# Patient Record
Sex: Male | Born: 1970 | Race: Black or African American | Hispanic: No | Smoking: Current every day smoker
Health system: Southern US, Community
[De-identification: ages and names within clinical notes are randomized; demographics above are authoritative.]

## PROBLEM LIST (undated history)

## (undated) DIAGNOSIS — I10 Essential (primary) hypertension: Secondary | ICD-10-CM

## (undated) DIAGNOSIS — K859 Acute pancreatitis without necrosis or infection, unspecified: Secondary | ICD-10-CM

## (undated) DIAGNOSIS — E119 Type 2 diabetes mellitus without complications: Secondary | ICD-10-CM

---

## 2020-02-27 ENCOUNTER — Inpatient Hospital Stay
Admission: EM | Admit: 2020-02-27 | Discharge: 2020-02-29 | DRG: 439 | Disposition: A | Discharge status: Home / Self Care | Payer: BC Managed Care – PPO | Attending: Internal Medicine | Admitting: Internal Medicine

## 2020-02-27 ENCOUNTER — Emergency Department: Payer: BC Managed Care – PPO

## 2020-02-27 ENCOUNTER — Encounter: Payer: Self-pay | Admitting: Emergency Medicine

## 2020-02-27 ENCOUNTER — Other Ambulatory Visit: Payer: Self-pay

## 2020-02-27 DIAGNOSIS — Z888 Allergy status to other drugs, medicaments and biological substances status: Secondary | ICD-10-CM | POA: Diagnosis not present

## 2020-02-27 DIAGNOSIS — Z20822 Contact with and (suspected) exposure to covid-19: Secondary | ICD-10-CM | POA: Diagnosis present

## 2020-02-27 DIAGNOSIS — E781 Pure hyperglyceridemia: Secondary | ICD-10-CM | POA: Diagnosis present

## 2020-02-27 DIAGNOSIS — Z8249 Family history of ischemic heart disease and other diseases of the circulatory system: Secondary | ICD-10-CM | POA: Diagnosis not present

## 2020-02-27 DIAGNOSIS — R1013 Epigastric pain: Secondary | ICD-10-CM

## 2020-02-27 DIAGNOSIS — E860 Dehydration: Secondary | ICD-10-CM | POA: Diagnosis present

## 2020-02-27 DIAGNOSIS — Z720 Tobacco use: Secondary | ICD-10-CM | POA: Diagnosis present

## 2020-02-27 DIAGNOSIS — F1010 Alcohol abuse, uncomplicated: Secondary | ICD-10-CM | POA: Diagnosis present

## 2020-02-27 DIAGNOSIS — E876 Hypokalemia: Secondary | ICD-10-CM | POA: Diagnosis present

## 2020-02-27 DIAGNOSIS — E1169 Type 2 diabetes mellitus with other specified complication: Secondary | ICD-10-CM | POA: Diagnosis present

## 2020-02-27 DIAGNOSIS — K852 Alcohol induced acute pancreatitis without necrosis or infection: Secondary | ICD-10-CM | POA: Diagnosis present

## 2020-02-27 DIAGNOSIS — Z794 Long term (current) use of insulin: Secondary | ICD-10-CM

## 2020-02-27 DIAGNOSIS — Z79899 Other long term (current) drug therapy: Secondary | ICD-10-CM | POA: Diagnosis not present

## 2020-02-27 DIAGNOSIS — IMO0002 Reserved for concepts with insufficient information to code with codable children: Secondary | ICD-10-CM | POA: Diagnosis present

## 2020-02-27 DIAGNOSIS — K859 Acute pancreatitis without necrosis or infection, unspecified: Secondary | ICD-10-CM | POA: Diagnosis not present

## 2020-02-27 DIAGNOSIS — F17210 Nicotine dependence, cigarettes, uncomplicated: Secondary | ICD-10-CM | POA: Diagnosis present

## 2020-02-27 DIAGNOSIS — E871 Hypo-osmolality and hyponatremia: Secondary | ICD-10-CM | POA: Diagnosis present

## 2020-02-27 DIAGNOSIS — K861 Other chronic pancreatitis: Secondary | ICD-10-CM | POA: Diagnosis present

## 2020-02-27 DIAGNOSIS — E785 Hyperlipidemia, unspecified: Secondary | ICD-10-CM | POA: Diagnosis present

## 2020-02-27 DIAGNOSIS — I10 Essential (primary) hypertension: Secondary | ICD-10-CM | POA: Diagnosis present

## 2020-02-27 HISTORY — DX: Acute pancreatitis without necrosis or infection, unspecified: K85.90

## 2020-02-27 HISTORY — DX: Essential (primary) hypertension: I10

## 2020-02-27 HISTORY — DX: Type 2 diabetes mellitus without complications: E11.9

## 2020-02-27 LAB — COMPREHENSIVE METABOLIC PANEL
ALT: 22 U/L (ref 0–44)
ALT: 22 U/L (ref 0–44)
AST: 22 U/L (ref 15–41)
AST: 22 U/L (ref 15–41)
Albumin: 4.8 g/dL (ref 3.5–5.0)
Albumin: 4.6 g/dL (ref 3.5–5.0)
Alkaline Phosphatase: 46 U/L (ref 38–126)
Alkaline Phosphatase: 43 U/L (ref 38–126)
Anion gap: 12 (ref 5–15)
Anion gap: 12 (ref 5–15)
BUN: 14 mg/dL (ref 6–20)
BUN: 13 mg/dL (ref 6–20)
CO2: 24 mmol/L (ref 22–32)
CO2: 26 mmol/L (ref 22–32)
Calcium: 9.7 mg/dL (ref 8.9–10.3)
Calcium: 9.2 mg/dL (ref 8.9–10.3)
Chloride: 96 mmol/L — ABNORMAL LOW (ref 98–111)
Chloride: 92 mmol/L — ABNORMAL LOW (ref 98–111)
Creatinine, Ser: 1.02 mg/dL (ref 0.61–1.24)
Creatinine, Ser: 0.91 mg/dL (ref 0.61–1.24)
GFR calc Af Amer: >60 mL/min (ref >60)
GFR calc Af Amer: >60 mL/min (ref >60)
GFR calc non Af Amer: >60 mL/min (ref >60)
GFR calc non Af Amer: >60 mL/min (ref >60)
Glucose, Bld: 145 mg/dL — ABNORMAL HIGH (ref 70–99)
Glucose, Bld: 145 mg/dL — ABNORMAL HIGH (ref 70–99)
Potassium: 3.9 mmol/L (ref 3.5–5.1)
Potassium: 3.4 mmol/L — ABNORMAL LOW (ref 3.5–5.1)
Sodium: 132 mmol/L — ABNORMAL LOW (ref 135–145)
Sodium: 130 mmol/L — ABNORMAL LOW (ref 135–145)
Total Bilirubin: 1.1 mg/dL (ref 0.3–1.2)
Total Bilirubin: 1 mg/dL (ref 0.3–1.2)
Total Protein: 8.1 g/dL (ref 6.5–8.1)
Total Protein: 7.6 g/dL (ref 6.5–8.1)

## 2020-02-27 LAB — URINALYSIS, COMPLETE (UACMP) WITH MICROSCOPIC
Bacteria, UA: NONE SEEN
Bilirubin Urine: NEGATIVE
Glucose, UA: 50 mg/dL — AB
Hgb urine dipstick: NEGATIVE
Ketones, ur: NEGATIVE mg/dL
Leukocytes,Ua: NEGATIVE
Nitrite: NEGATIVE
Protein, ur: NEGATIVE mg/dL
Specific Gravity, Urine: 1.023 (ref 1.005–1.030)
Squamous Epithelial / HPF: NONE SEEN (ref 0–5)
WBC, UA: NONE SEEN WBC/hpf (ref 0–5)
pH: 7 (ref 5.0–8.0)

## 2020-02-27 LAB — CBC
HCT: 45.9 % (ref 39.0–52.0)
Hemoglobin: 15.4 g/dL (ref 13.0–17.0)
MCH: 28.8 pg (ref 26.0–34.0)
MCHC: 33.6 g/dL (ref 30.0–36.0)
MCV: 85.8 fL (ref 80.0–100.0)
Platelets: 220 10*3/uL (ref 150–400)
RBC: 5.35 MIL/uL (ref 4.22–5.81)
RDW: 13.1 % (ref 11.5–15.5)
WBC: 15.4 10*3/uL — ABNORMAL HIGH (ref 4.0–10.5)
nRBC: 0 % (ref 0.0–0.2)

## 2020-02-27 LAB — CK: Total CK: 67 U/L (ref 49–397)

## 2020-02-27 LAB — PHOSPHORUS: Phosphorus: 3.6 mg/dL (ref 2.5–4.6)

## 2020-02-27 LAB — MAGNESIUM: Magnesium: 1.8 mg/dL (ref 1.7–2.4)

## 2020-02-27 LAB — LIPID PANEL
Cholesterol: 222 mg/dL — ABNORMAL HIGH (ref 0–200)
HDL: 53 mg/dL (ref >40)
LDL Cholesterol: 128 mg/dL — ABNORMAL HIGH (ref 0–99)
Total CHOL/HDL Ratio: 4.2 RATIO
Triglycerides: 205 mg/dL — ABNORMAL HIGH (ref <150)
VLDL: 41 mg/dL — ABNORMAL HIGH (ref 0–40)

## 2020-02-27 LAB — SARS CORONAVIRUS 2 BY RT PCR (HOSPITAL ORDER, PERFORMED IN ~~LOC~~ HOSPITAL LAB): SARS Coronavirus 2: NEGATIVE

## 2020-02-27 LAB — LACTIC ACID, PLASMA: Lactic Acid, Venous: 1.4 mmol/L (ref 0.5–1.9)

## 2020-02-27 LAB — PROTIME-INR
INR: 0.9 (ref 0.8–1.2)
Prothrombin Time: 12 seconds (ref 11.4–15.2)

## 2020-02-27 LAB — ETHANOL: Alcohol, Ethyl (B): <10 mg/dL (ref <10)

## 2020-02-27 LAB — LIPASE, BLOOD: Lipase: 729 U/L — ABNORMAL HIGH (ref 11–51)

## 2020-02-27 MED ORDER — NICOTINE 14 MG/24HR TD PT24
14.0000 mg | MEDICATED_PATCH | Freq: Every day | TRANSDERMAL | Status: DC
  Filled 2020-02-27: qty 1

## 2020-02-27 MED ORDER — SODIUM CHLORIDE 0.9 % IV SOLN: INTRAVENOUS | Status: DC

## 2020-02-27 MED ORDER — THIAMINE HCL 100 MG PO TABS
100.0000 mg | ORAL_TABLET | Freq: Every day | ORAL | Status: DC
  Administered 2020-02-28 – 2020-02-29 (×2): 100 mg via ORAL
  Filled 2020-02-27 (×2): qty 1

## 2020-02-27 MED ORDER — INSULIN GLARGINE 100 UNIT/ML ~~LOC~~ SOLN
15.0000 [IU] | Freq: Every day | SUBCUTANEOUS | Status: DC
  Administered 2020-02-28 (×2): 15 [IU] via SUBCUTANEOUS
  Filled 2020-02-27 (×3): qty 0.15

## 2020-02-27 MED ORDER — IOHEXOL 300 MG/ML  SOLN
100.0000 mL | Freq: Once | INTRAMUSCULAR | Status: AC | PRN
  Administered 2020-02-27: 100 mL via INTRAVENOUS

## 2020-02-27 MED ORDER — LACTATED RINGERS IV BOLUS
1000.0000 mL | Freq: Once | INTRAVENOUS | Status: AC
  Administered 2020-02-27: 1000 mL via INTRAVENOUS

## 2020-02-27 MED ORDER — SODIUM CHLORIDE 0.9% FLUSH
3.0000 mL | Freq: Two times a day (BID) | INTRAVENOUS | Status: DC
  Administered 2020-02-28 – 2020-02-29 (×4): 3 mL via INTRAVENOUS

## 2020-02-27 MED ORDER — PNEUMOCOCCAL VAC POLYVALENT 25 MCG/0.5ML IJ INJ: 0.5000 mL | INJECTION | INTRAMUSCULAR | Status: DC

## 2020-02-27 MED ORDER — ONDANSETRON HCL 4 MG PO TABS: 4.0000 mg | ORAL_TABLET | Freq: Four times a day (QID) | ORAL | Status: DC | PRN

## 2020-02-27 MED ORDER — MORPHINE SULFATE (PF) 4 MG/ML IV SOLN
6.0000 mg | Freq: Once | INTRAVENOUS | Status: AC
  Administered 2020-02-27: 6 mg via INTRAVENOUS
  Filled 2020-02-27: qty 2

## 2020-02-27 MED ORDER — FOLIC ACID 1 MG PO TABS
1.0000 mg | ORAL_TABLET | Freq: Every day | ORAL | Status: DC
  Administered 2020-02-28 – 2020-02-29 (×2): 1 mg via ORAL
  Filled 2020-02-27 (×2): qty 1

## 2020-02-27 MED ORDER — THIAMINE HCL 100 MG/ML IJ SOLN: 100.0000 mg | Freq: Every day | INTRAMUSCULAR | Status: DC

## 2020-02-27 MED ORDER — HYDRALAZINE HCL 20 MG/ML IJ SOLN: 10.0000 mg | INTRAMUSCULAR | Status: DC | PRN

## 2020-02-27 MED ORDER — ADULT MULTIVITAMIN W/MINERALS CH
1.0000 | ORAL_TABLET | Freq: Every day | ORAL | Status: DC
  Administered 2020-02-28 – 2020-02-29 (×2): 1 via ORAL
  Filled 2020-02-27 (×2): qty 1

## 2020-02-27 MED ORDER — ONDANSETRON HCL 4 MG/2ML IJ SOLN: 4.0000 mg | Freq: Four times a day (QID) | INTRAMUSCULAR | Status: DC | PRN

## 2020-02-27 MED ORDER — HYDROMORPHONE HCL 1 MG/ML IJ SOLN
0.5000 mg | INTRAMUSCULAR | Status: DC | PRN
  Administered 2020-02-27 – 2020-02-29 (×8): 1 mg via INTRAVENOUS
  Filled 2020-02-27 (×8): qty 1

## 2020-02-27 MED ORDER — HYDRALAZINE HCL 20 MG/ML IJ SOLN
INTRAMUSCULAR | Status: AC
  Administered 2020-02-27: 10 mg via INTRAVENOUS
  Filled 2020-02-27: qty 1

## 2020-02-27 MED ORDER — INSULIN ASPART 100 UNIT/ML ~~LOC~~ SOLN
0.0000 [IU] | SUBCUTANEOUS | Status: DC
  Administered 2020-02-28 (×5): 2 [IU] via SUBCUTANEOUS
  Administered 2020-02-29: 5 [IU] via SUBCUTANEOUS
  Administered 2020-02-29: 1 [IU] via SUBCUTANEOUS
  Administered 2020-02-29: 5 [IU] via SUBCUTANEOUS
  Administered 2020-02-29: 2 [IU] via SUBCUTANEOUS
  Filled 2020-02-27 (×9): qty 1

## 2020-02-27 MED ORDER — POTASSIUM CHLORIDE 10 MEQ/100ML IV SOLN
10.0000 meq | INTRAVENOUS | Status: AC
  Administered 2020-02-28 (×2): 10 meq via INTRAVENOUS
  Filled 2020-02-27: qty 100

## 2020-02-27 MED ORDER — LORAZEPAM 2 MG/ML IJ SOLN
1.0000 mg | INTRAMUSCULAR | Status: DC | PRN
  Filled 2020-02-27: qty 1

## 2020-02-27 MED ORDER — LORAZEPAM 1 MG PO TABS
1.0000 mg | ORAL_TABLET | ORAL | Status: DC | PRN
  Administered 2020-02-28 (×2): 2 mg via ORAL
  Filled 2020-02-27 (×3): qty 2

## 2020-02-27 MED ORDER — HYDROMORPHONE HCL 1 MG/ML IJ SOLN
0.5000 mg | Freq: Once | INTRAMUSCULAR | Status: AC
  Administered 2020-02-27: 0.5 mg via INTRAVENOUS
  Filled 2020-02-27: qty 1

## 2020-02-27 NOTE — ED Provider Notes (Signed)
[facility] Emergency Department Provider Note ____________________________________________   First MD Initiated Contact with Patient 02/27/20 1741     (approximate)  I have reviewed the triage vital signs and the nursing notes.  HISTORY  Chief Complaint Abdominal Pain   HPI Micheal Wong is a 49 y.o. male with abdominal pain.   Patient with a history of previous episodes of pancreatitis, precipitated by alcohol.  Reports remotely drinking "a lot more alcohol than I do now."  Patient reports drinking more heavily this past weekend as recently as 4 days ago, and then eating some "strong food" for the past 3 days that he reports is the culprit for his abdominal pain.  He reports associated nausea without vomiting.  Denies stool changes.  Denies fevers.  Reports having constant aching epigastric pain of 9/10 intensity.  Past Medical History:  Diagnosis Date  . Diabetes mellitus without complication (HCC)   . Hypertension   . Pancreatitis     Patient Active Problem List   Diagnosis Date Noted  . Pancreatitis 02/27/2020  . Type 2 diabetes mellitus with hyperlipidemia (HCC) 02/27/2020  . Hyperlipemia 02/27/2020  . Dehydration 02/27/2020  . Pancreatitis, recurrent 02/27/2020  . ETOH abuse 02/27/2020  . Tobacco abuse 02/27/2020    History reviewed. No pertinent surgical history.  Prior to Admission medications   Medication Sig Start Date End Date Taking? Authorizing Provider  amLODipine (NORVASC) 10 MG tablet Take 10 mg by mouth daily.   Yes [provider]  atorvastatin (LIPITOR) 10 MG tablet Take 10 mg by mouth daily. Does not know dose   Yes [provider]  fenofibrate (TRICOR) 48 MG tablet Take 48 mg by mouth daily. Does not know dose   Yes [provider]  hydrochlorothiazide (HYDRODIURIL) 25 MG tablet Take 25 mg by mouth daily.   Yes [provider]  insulin glargine (LANTUS) 100 UNIT/ML injection Inject 30  Units into the skin daily.   Yes [provider]  metFORMIN (GLUCOPHAGE) 500 MG tablet Take 500 mg by mouth daily with breakfast. Not sure of the dose   Yes [provider]    Allergies Drug class [ace inhibitors]  History reviewed. No pertinent family history.  Social History Social History   Tobacco Use  . Smoking status: Current Every Day Smoker    Packs/day: 0.50    Types: Cigarettes  . Smokeless tobacco: Never Used  Substance Use Topics  . Alcohol use: Yes    Comment: occasionally  . Drug use: Not on file    Review of Systems  Constitutional: No fever/chills Eyes: No visual changes. ENT: No sore throat. Cardiovascular: Denies chest pain. Respiratory: Denies shortness of breath. Gastrointestinal: no vomiting.  No diarrhea.  No constipation.  Positive for nausea and abdominal pain. Genitourinary: Negative for dysuria. Musculoskeletal: Negative for back pain. Skin: Negative for rash. Neurological: Negative for headaches, focal weakness or numbness.   ____________________________________________   PHYSICAL EXAM:  VITAL SIGNS: ED Triage Vitals  Enc Vitals Group     BP 02/27/20 1640 (!) 144/98     Pulse Rate 02/27/20 1640 102     Resp 02/27/20 1640 16     Temp 02/27/20 1640 98.9 F (37.2 C)     Temp Source 02/27/20 1640 Oral     SpO2 02/27/20 1640 98 %     Weight 02/27/20 1641 200 lb (90.7 kg)     Height 02/27/20 1641 5\' 11"  (1.803 m)     Head Circumference --  Peak Flow --      Pain Score 02/27/20 1641 8     Pain Loc --      Pain Edu? --      Excl. in GC? --    Constitutional: Alert and oriented.  Uncomfortable-appearing, but conversational in full sentences.. Eyes: Conjunctivae are normal. PERRL. EOMI. Head: Atraumatic. Nose: No congestion/rhinnorhea. Mouth/Throat: Mucous membranes are dry.  Oropharynx non-erythematous. Neck: No stridor. No cervical spine tenderness to palpation. Cardiovascular: Tachycardic and regular. Grossly  normal heart sounds.  Good peripheral circulation. Respiratory: Normal respiratory effort.  No retractions. Lungs CTAB. Gastrointestinal: Soft , nondistended. No abdominal bruits. No CVA tenderness. Epigastric tenderness to palpation without peritoneal features.  Abdomen otherwise benign. Musculoskeletal: No lower extremity tenderness nor edema.  No joint effusions. No signs of acute trauma. Neurologic:  Normal speech and language. No gross focal neurologic deficits are appreciated. No gait instability noted. Skin:  Skin is warm, dry and intact. No rash noted. Psychiatric: Mood and affect are normal. Speech and behavior are normal. ____________________________________________   LABS (all labs ordered are listed, but only abnormal results are displayed)  Labs Reviewed  LIPASE, BLOOD - Abnormal; Notable for the following components:      Result Value   Lipase 729 (*)    All other components within normal limits  COMPREHENSIVE METABOLIC PANEL - Abnormal; Notable for the following components:   Sodium 132 (*)    Chloride 96 (*)    Glucose, Bld 145 (*)    All other components within normal limits  CBC - Abnormal; Notable for the following components:   WBC 15.4 (*)    All other components within normal limits  SARS CORONAVIRUS 2 BY RT PCR (HOSPITAL ORDER, PERFORMED IN [facility])  CK  MAGNESIUM  PHOSPHORUS  URINALYSIS, COMPLETE (UACMP) WITH MICROSCOPIC  PROTIME-INR  ETHANOL  LIPID PANEL  LACTIC ACID, PLASMA  LIPASE, BLOOD  COMPREHENSIVE METABOLIC PANEL   ____________________________________________  12 Lead EKG Sinus rhythm rate of 107, normal axis and intervals.  No evidence of acute ischemia.  ____________________________________________  RADIOLOGY  ED MD interpretation:    Official radiology report(s): CT ABDOMEN PELVIS W CONTRAST  Result Date: 02/27/2020 CLINICAL DATA:  Acute pancreatitis, abdominal pain, pancreatic pseudocyst EXAM: CT ABDOMEN AND  PELVIS WITH CONTRAST TECHNIQUE: Multidetector CT imaging of the abdomen and pelvis was performed using the standard protocol following bolus administration of intravenous contrast. CONTRAST:  OMNIPAQUE IOHEXOL 300 MG/ML  SOLN COMPARISON:  None. FINDINGS: Lower chest: Mild bibasilar atelectasis. The visualized heart and pericardium are unremarkable. Hepatobiliary: Mild hepatic steatosis. Gallbladder unremarkable. No intra or extrahepatic biliary ductal dilation. Pancreas: There is moderate peripancreatic inflammatory stranding involving the uncinate process, head, and proximal body of the pancreas in keeping with changes of acute pancreatitis. Normal enhancement of the pancreatic parenchyma. Mild peripancreatic shotty adenopathy is likely reactive in nature. Inflammatory changes extend to involve the second portion of the duodenum without evidence of obstruction. No intraparenchymal calcifications. No peripancreatic fluid collections are identified. The pancreatic duct is not dilated. Spleen: Normal in size.  No focal intrasplenic lesions. Adrenals/Urinary Tract: The adrenal glands are unremarkable. Vascular calcifications within the lower pole of the left kidney. The kidneys are unremarkable. The bladder is moderately distended, but is otherwise unremarkable. Stomach/Bowel: Moderate stool is seen within the cecum without evidence of obstruction. The stomach, small bowel, and large bowel are otherwise unremarkable. Appendix normal. No free intraperitoneal gas or fluid. Vascular/Lymphatic: Moderate aortoiliac atherosclerotic calcification. No abdominal aortic  aneurysm. 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 Reproductive: Mild prostatic enlargement. Seminal vesicles are unremarkable. Other: Rectum unremarkable. Musculoskeletal: No acute bone  abnormality. IMPRESSION: Acute, uncomplicated pancreatitis involving the proximal portion of the pancreas. No evidence of biliary or pancreatic ductal obstruction. No evidence of pancreatic necrosis. No peripancreatic fluid collections identified. Aortic Atherosclerosis (ICD10-I70.0). Electronically Signed   By: Helyn Numbers MD   On: 02/27/2020 20:59    ____________________________________________   PROCEDURES  Procedure(s) performed (including Critical Care):  Procedures   ____________________________________________   INITIAL IMPRESSION / ASSESSMENT AND PLAN / ED COURSE  49 year old man with history of alcohol induced pancreatitis presenting with additional acute episode of pancreatitis, without evidence of complications, and requiring medical admission for pain control and fluid resuscitation.  Tender epigastric abdomen without peritoneal features.  Lipase of 730.  No evidence of significant alcoholic damage or pathology on his metabolic panel.  CT without evidence of pseudocyst.  Patient requiring multiple rounds of IV narcotics and boluses of fluid, and so will admit the patient to medicine for further work-up and management.  ____________________________________________   FINAL CLINICAL IMPRESSION(S) / ED DIAGNOSES  Final diagnoses:  Epigastric pain  Alcohol-induced acute pancreatitis, unspecified complication status     ED Discharge Orders    None       Walton Digilio   Note:  This document was prepared using Dragon voice recognition software and may include unintentional dictation errors.   Delton Prairie, MD 02/27/20 2137

## 2020-02-27 NOTE — ED Triage Notes (Signed)
First Nurse Note:  C/O abdominal pain x 1 day. States has history of pancreatitis and feels that this is a flare.    Recently relocated to area from [place].  Will also need medication refill for meloxicam and neurontin.

## 2020-02-27 NOTE — ED Triage Notes (Signed)
Patient presents to the ED with epigastric pain that began yesterday while he was at work.  Patient states he had a couple of big meals on Sunday and Monday.  Patient reports history of pancreatitis and states he feels the same.  Patient states he drank alcohol on Saturday.  States, "not enough to flare it up."

## 2020-02-27 NOTE — H&P (Addendum)
Micheal Wong JSH:[id number] DOB: 06-Aug-1971 DOA: 02/27/2020    PCP: Patient, No Pcp Per   Outpatient Specialists: NONE   Patient arrived to ER on 02/27/20 at 1632 Referred by Attending Delton Prairie, MD   Patient coming from: home Lives alone,      Chief Complaint  Chief Complaint  Patient presents with  . Abdominal Pain    HPI: Micheal Wong is a 49 y.o. male with medical history significant of ETOH abuse, pancreatitis, tobacco abuse, HTn, HLD, DM2    Presented with  abdominal pain for the past 24 h similar to prior pancratititis patient recently moved to the area. Have had rent increase in [place] Reports he only drinks on occasion but 4 days ago had 3 shots Reports he smokes Denies hx of EtOh withdrawal  Infectious risk factors:  Reports abdominal pain,     Has been fully vaccinated against COVID    Initial COVID TEST  NEGATIVE   Lab Results  Component Value Date   SARSCOV2NAA NEGATIVE 02/27/2020     Regarding pertinent Chronic problems:    Hyperlipidemia -  on statins lipitor and fenofibrate     HTN on Norvasc and HCTZ     DM 2 -  on insulin, PO meds       While in ER: Lipase 700  epigastric abd pain and elevated WBC  Hospitalist was called for admission for pancreatitis  The following Work up has been ordered so far:  Orders Placed This Encounter  Procedures  . SARS Coronavirus 2 by RT PCR (hospital order, performed in [facility] hospital lab) Nasopharyngeal Nasopharyngeal Swab  . CT ABDOMEN PELVIS W CONTRAST  . Lipase, blood  . Comprehensive metabolic panel  . CBC  . Urinalysis, Complete w Microscopic  . Protime-INR  . Ethanol  . Lipid panel  . Diet NPO time specified  . Consult to hospitalist  ALL PATIENTS BEING ADMITTED/HAVING PROCEDURES NEED COVID-19 SCREENING  . ED EKG  . EKG 12-Lead    Following Medications were ordered in ER: Medications  lactated ringers bolus 1,000 mL ( Intravenous Stopped 02/27/20 1941)  morphine 4 MG/ML  injection 6 mg (6 mg Intravenous Given 02/27/20 1838)  morphine 4 MG/ML injection 6 mg (6 mg Intravenous Given 02/27/20 1949)  lactated ringers bolus 1,000 mL (1,000 mLs Intravenous New Bag/Given 02/27/20 1950)        Consult Orders  (From admission, onward)         Start     Ordered   02/27/20 1929  Consult to hospitalist  ALL PATIENTS BEING ADMITTED/HAVING PROCEDURES NEED COVID-19 SCREENING  Once       Comments: ALL PATIENTS BEING ADMITTED/HAVING PROCEDURES NEED COVID-19 SCREENING  Provider:  (Not yet assigned)  Question Answer Comment  Place call to: hospitalist   Reason for Consult Admit q  Diagnosis/Clinical Info for Consult: acute appendicitis Room Ludwig Lean, [id number]     02/27/20 1930           Significant initial  Findings: Abnormal Labs Reviewed  LIPASE, BLOOD - Abnormal; Notable for the following components:      Result Value   Lipase 729 (*)    All other components within normal limits  COMPREHENSIVE METABOLIC PANEL - Abnormal; Notable for the following components:   Sodium 132 (*)    Chloride 96 (*)    Glucose, Bld 145 (*)    All other components within normal limits  CBC - Abnormal; Notable for the following components:  WBC 15.4 (*)    All other components within normal limits    Otherwise labs showing:    Recent Labs  Lab 02/27/20 1643  NA 132*  K 3.9  CO2 24  GLUCOSE 145*  BUN 14  CREATININE 1.02  CALCIUM 9.7    Cr stable,   Lab Results  Component Value Date   CREATININE 1.02 02/27/2020    Recent Labs  Lab 02/27/20 1643  AST 22  ALT 22  ALKPHOS 46  BILITOT 1.1  PROT 8.1  ALBUMIN 4.8   Lab Results  Component Value Date   CALCIUM 9.7 02/27/2020      WBC      Component Value Date/Time   WBC 15.4 (H) 02/27/2020 1643   ANC No results found for: NEUTROABS ALC No components found for: LYMPHAB    Plt: Lab Results  Component Value Date   PLT 220 02/27/2020    Lactic Acid, Venous    Component Value Date/Time    LATICACIDVEN 1.4 02/27/2020 2059        HG/HCT  stable,       Component Value Date/Time   HGB 15.4 02/27/2020 1643   HCT 45.9 02/27/2020 1643    Recent Labs  Lab 02/27/20 1643  LIPASE 729*   No results for input(s): AMMONIA in the last 168 hours.   ECG: Ordered Personally reviewed by me showing: HR : 99 Rhythm:  NSR,    no evidence of ischemic changes QTC 472     Ordered    CTabd/pelvis -acute uncomplicated pancreatitis of the proximal portion of the pancreas no pseudocyst     ED Triage Vitals  Enc Vitals Group     BP 02/27/20 1640 (!) 144/98     Pulse Rate 02/27/20 1640 102     Resp 02/27/20 1640 16     Temp 02/27/20 1640 98.9 F (37.2 C)     Temp Source 02/27/20 1640 Oral     SpO2 02/27/20 1640 98 %     Weight 02/27/20 1641 200 lb (90.7 kg)     Height 02/27/20 1641 5\' 11"  (1.803 m)     Head Circumference --      Peak Flow --      Pain Score 02/27/20 1641 8     Pain Loc --      Pain Edu? --      Excl. in GC? --   TMAX(24)@       Latest  Blood pressure (!) 170/82, pulse 97, temperature 98.9 F (37.2 C), temperature source Oral, resp. rate 16, height 5\' 11"  (1.803 m), weight 90.7 kg, SpO2 98 %.      Review of Systems:    Pertinent positives include:  abdominal pain,  Constitutional:  No weight loss, night sweats, Fevers, chills, fatigue, weight loss  HEENT:  No headaches, Difficulty swallowing,Tooth/dental problems,Sore throat,  No sneezing, itching, ear ache, nasal congestion, post nasal drip,  Cardio-vascular:  No chest pain, Orthopnea, PND, anasarca, dizziness, palpitations.no Bilateral lower extremity swelling  GI:  No heartburn, indigestion,  nausea, vomiting, diarrhea, change in bowel habits, loss of appetite, melena, blood in stool, hematemesis Resp:  no shortness of breath at rest. No dyspnea on exertion, No excess mucus, no productive cough, No non-productive cough, No coughing up of blood.No change in color of mucus.No wheezing. Skin:  no  rash or lesions. No jaundice GU:  no dysuria, change in color of urine, no urgency or frequency. No straining to urinate.  No flank  pain.  Musculoskeletal:  No joint pain or no joint swelling. No decreased range of motion. No back pain.  Psych:  No change in mood or affect. No depression or anxiety. No memory loss.  Neuro: no localizing neurological complaints, no tingling, no weakness, no double vision, no gait abnormality, no slurred speech, no confusion  All systems reviewed and apart from HOPI all are negative  Past Medical History:   Past Medical History:  Diagnosis Date  . Diabetes mellitus without complication (HCC)   . Hypertension   . Pancreatitis       History reviewed. No pertinent surgical history.  Social History:  Ambulatory  independently       reports that he has been smoking cigarettes. He has been smoking about 0.50 packs per day. He has never used smokeless tobacco. He reports current alcohol use. No history on file for drug use.  Family History: Hypertension  Allergies: Allergies  Allergen Reactions  . Drug Class [Ace Inhibitors]     Swelling     Prior to Admission medications   Not on File   Physical Exam: Vitals with BMI 02/27/2020 02/27/2020 02/27/2020  Height - - 5\' 11"   Weight - - 200 lbs  BMI - - 27.91  Systolic 170 175 -  Diastolic 82 95 -  Pulse 97 93 -     1. General:  in No  Acute distress   well  -appearing 2. Psychological: slightly lethargicand  Oriented 3. Head/ENT:    Dry Mucous Membranes                          Head Non traumatic, neck supple                           Poor Dentition 4. SKIN: decreased Skin turgor,  Skin clean Dry and intact no rash 5. Heart: Regular rate and rhythm no Murmur, no Rub or gallop 6. Lungs: no wheezes or crackles   7. Abdomen: Soft, epigastrictender, Non distended obese  bowel sounds present 8. Lower extremities: no clubbing, cyanosis, no  edema 9. Neurologically Grossly intact, moving all  4 extremities equally  10. MSK: Normal range of motion   All other LABS:     Recent Labs  Lab 02/27/20 1643  WBC 15.4*  HGB 15.4  HCT 45.9  MCV 85.8  PLT 220     Recent Labs  Lab 02/27/20 1643  NA 132*  K 3.9  CL 96*  CO2 24  GLUCOSE 145*  BUN 14  CREATININE 1.02  CALCIUM 9.7     Recent Labs  Lab 02/27/20 1643  AST 22  ALT 22  ALKPHOS 46  BILITOT 1.1  PROT 8.1  ALBUMIN 4.8       Cultures: No results found for: SDES, SPECREQUEST, CULT, REPTSTATUS   Radiological Exams on Admission: No results found.  Chart has been reviewed    Assessment/Plan   49 y.o. male with medical history significant of ETOH abuse, pancreatitis, tobacco abuse, HTn, HLD, DM2 Admitted for alcohol induced pancreatitis  Present on Admission: . Pancreatitis - most likely cause being alcohol induced,   CT of abdomen showing acute pancreatitis   no evidence of pseudocyst Will rehydrate with aggressive IV fluids Keep n.p.o. Follow clinically  -  strongly advised patient to stop drinking alcohol -Check lipid panel - Discontinue offending medications ( hydrochlorothiazide  ) Control pain with IV pain medications  If no significant improvement in the next 24 hours may benefit from GI consult Check lipase in a.m.  Marland Kitchen Type 2 diabetes mellitus with hyperlipidemia (HCC) -  - Order Sensitive  SSI   - continue home insulin regimen decreased Lantus 15 units, while n.p.o.  -  check TSH and HgA1C  - Hold by mouth medications    . Hyperlipemia -resume home medications when able to tolerate  . Dehydration -rehydrate with IV fluid     . ETOH abuse monitor for any sign of withdrawal order CIWA protocol  . Tobacco abuse -    - order nicotine patch   - nursing tobacco cessation protocol  Hypokalemia will replace and check magnesium level  Hyponatremia in the setting of dehydration check urine electrolytes gently rehydrate and follow sodium   Other plan as per orders.  DVT  prophylaxis:  SCD      Code Status:    Code Status: Not on file FULL CODE    as per patient   I had personally discussed CODE STATUS with patient      Family Communication:   Family not at  Bedside    Disposition Plan:    To home once workup is complete and patient is stable   Following barriers for discharge:                                                           Pain controlled with PO medications                                                                              Transition of care consulted                                  Consults called: none  Admission status:  ED Disposition    ED Disposition Condition Comment   Admit  Hospital Area: [facility] [[id number]]  Level of Care: Med-Surg [16]  Covid Evaluation: Asymptomatic Screening Protocol (No Symptoms)  Diagnosis: Pancreatitis [169678]  Admitting Physician: Therisa Doyne [[id number]]  Attending Physician: Therisa Doyne [[id number]]  Estimated length of stay: past midnight tomorrow  Certification:: I certify this patient will need inpatient services for at least 2 midnights          inpatient     I Expect 2 midnight stay secondary to severity of patient's current illness need for inpatient interventions justified by the following:    Severe lab/radiological/exam abnormalities including:    pancreatitis and extensive comorbidities including:  substance abuse   DM2     That are currently affecting medical management.   I expect  patient to be hospitalized for 2 midnights requiring inpatient medical care.  Patient is at high risk for adverse outcome (such as loss of life or disability) if not treated.  Indication for inpatient stay as follows:     severe pain requiring acute inpatient management,  inability to maintain oral  hydration    Need for  IV fluids,  IV pain medications     Level of care    tele  For   24H      Lab Results  Component Value Date   SARSCOV2NAA  NEGATIVE 02/27/2020     Precautions: admitted as   Covid Negative     PPE: Used by the provider:   N95  eye Goggles,  Gloves     Marcell Chavarin 02/27/2020, 10:36 PM    [facility]     after 2 AM please page floor coverage PA If 7AM-7PM, please contact the day team taking care of the patient using [email or web address]   Patient was evaluated in the context of the global COVID-19 pandemic, which necessitated consideration that the patient might be at risk for infection with the SARS-CoV-2 virus that causes COVID-19. Institutional protocols and algorithms that pertain to the evaluation of patients at risk for COVID-19 are in a state of rapid change based on information released by regulatory bodies including the CDC and federal and state organizations. These policies and algorithms were followed during the patient's care.

## 2020-02-28 DIAGNOSIS — I10 Essential (primary) hypertension: Secondary | ICD-10-CM

## 2020-02-28 LAB — COMPREHENSIVE METABOLIC PANEL
ALT: 21 U/L (ref 0–44)
AST: 22 U/L (ref 15–41)
Albumin: 4.5 g/dL (ref 3.5–5.0)
Alkaline Phosphatase: 44 U/L (ref 38–126)
Anion gap: 15 (ref 5–15)
BUN: 12 mg/dL (ref 6–20)
CO2: 26 mmol/L (ref 22–32)
Calcium: 9.5 mg/dL (ref 8.9–10.3)
Chloride: 91 mmol/L — ABNORMAL LOW (ref 98–111)
Creatinine, Ser: 0.88 mg/dL (ref 0.61–1.24)
GFR calc Af Amer: >60 mL/min (ref >60)
GFR calc non Af Amer: >60 mL/min (ref >60)
Glucose, Bld: 155 mg/dL — ABNORMAL HIGH (ref 70–99)
Potassium: 3.9 mmol/L (ref 3.5–5.1)
Sodium: 132 mmol/L — ABNORMAL LOW (ref 135–145)
Total Bilirubin: 1.1 mg/dL (ref 0.3–1.2)
Total Protein: 7.9 g/dL (ref 6.5–8.1)

## 2020-02-28 LAB — CBC WITH DIFFERENTIAL/PLATELET
Abs Immature Granulocytes: 0.08 10*3/uL — ABNORMAL HIGH (ref 0.00–0.07)
Basophils Absolute: 0 10*3/uL (ref 0.0–0.1)
Basophils Relative: 0 %
Eosinophils Absolute: 0 10*3/uL (ref 0.0–0.5)
Eosinophils Relative: 0 %
HCT: 43.2 % (ref 39.0–52.0)
Hemoglobin: 15.3 g/dL (ref 13.0–17.0)
Immature Granulocytes: 1 %
Lymphocytes Relative: 3 %
Lymphs Abs: 0.5 10*3/uL — ABNORMAL LOW (ref 0.7–4.0)
MCH: 29.8 pg (ref 26.0–34.0)
MCHC: 35.4 g/dL (ref 30.0–36.0)
MCV: 84 fL (ref 80.0–100.0)
Monocytes Absolute: 1 10*3/uL (ref 0.1–1.0)
Monocytes Relative: 5 %
Neutro Abs: 16 10*3/uL — ABNORMAL HIGH (ref 1.7–7.7)
Neutrophils Relative %: 91 %
Platelets: 212 10*3/uL (ref 150–400)
RBC: 5.14 MIL/uL (ref 4.22–5.81)
RDW: 13.2 % (ref 11.5–15.5)
WBC: 17.6 10*3/uL — ABNORMAL HIGH (ref 4.0–10.5)
nRBC: 0 % (ref 0.0–0.2)

## 2020-02-28 LAB — PHOSPHORUS: Phosphorus: 3.2 mg/dL (ref 2.5–4.6)

## 2020-02-28 LAB — TSH: TSH: 1.862 u[IU]/mL (ref 0.350–4.500)

## 2020-02-28 LAB — LIPASE, BLOOD: Lipase: 603 U/L — ABNORMAL HIGH (ref 11–51)

## 2020-02-28 LAB — MAGNESIUM: Magnesium: 1.6 mg/dL — ABNORMAL LOW (ref 1.7–2.4)

## 2020-02-28 LAB — GLUCOSE, CAPILLARY
Glucose-Capillary: 159 mg/dL — ABNORMAL HIGH (ref 70–99)
Glucose-Capillary: 161 mg/dL — ABNORMAL HIGH (ref 70–99)
Glucose-Capillary: 163 mg/dL — ABNORMAL HIGH (ref 70–99)
Glucose-Capillary: 171 mg/dL — ABNORMAL HIGH (ref 70–99)
Glucose-Capillary: 192 mg/dL — ABNORMAL HIGH (ref 70–99)
Glucose-Capillary: 211 mg/dL — ABNORMAL HIGH (ref 70–99)

## 2020-02-28 LAB — HIV ANTIBODY (ROUTINE TESTING W REFLEX): HIV Screen 4th Generation wRfx: NONREACTIVE

## 2020-02-28 MED ORDER — SODIUM CHLORIDE 0.9 % IV SOLN: INTRAVENOUS | Status: DC

## 2020-02-28 MED ORDER — ATORVASTATIN CALCIUM 10 MG PO TABS
10.0000 mg | ORAL_TABLET | Freq: Every day | ORAL | Status: DC
  Administered 2020-02-28: 10 mg via ORAL
  Filled 2020-02-28: qty 1

## 2020-02-28 MED ORDER — OXYCODONE HCL 5 MG PO TABS
5.0000 mg | ORAL_TABLET | ORAL | Status: DC | PRN
  Administered 2020-02-28 – 2020-02-29 (×2): 5 mg via ORAL
  Filled 2020-02-28 (×2): qty 1

## 2020-02-28 MED ORDER — MAGNESIUM SULFATE 2 GM/50ML IV SOLN
2.0000 g | Freq: Once | INTRAVENOUS | Status: AC
  Administered 2020-02-28: 2 g via INTRAVENOUS
  Filled 2020-02-28: qty 50

## 2020-02-28 MED ORDER — FENOFIBRATE 54 MG PO TABS
54.0000 mg | ORAL_TABLET | Freq: Every day | ORAL | Status: DC
  Administered 2020-02-28 – 2020-02-29 (×2): 54 mg via ORAL
  Filled 2020-02-28 (×2): qty 1

## 2020-02-28 MED ORDER — AMLODIPINE BESYLATE 5 MG PO TABS
5.0000 mg | ORAL_TABLET | Freq: Every day | ORAL | Status: DC
  Administered 2020-02-28 – 2020-02-29 (×2): 5 mg via ORAL
  Filled 2020-02-28 (×2): qty 1

## 2020-02-28 NOTE — Progress Notes (Addendum)
Patient ID: Micheal Wong, male   DOB: 10-18-1970, 49 y.o.   MRN: [id number] [facility] PROGRESS NOTE  Ziere Docken DHR:[id number] DOB: 1971-04-07 DOA: 02/27/2020 PCP: Patient, No Pcp Per  HPI/Subjective: Patient coming in with abdominal pain.  Found to have an acute pancreatitis.  Patient states that he has had pancreatitis few times in the past.  No nausea or vomiting.  Patient states that he does have a little trouble urinating.  States his urine is slow at first but then able to urinate.  Objective: Vitals:   02/28/20 0800 02/28/20 1224  BP: (!) 148/84 125/79  Pulse: (!) 106 (!) 106  Resp: 16 16  Temp: 98.9 F (37.2 C) 98.7 F (37.1 C)  SpO2: 100% 100%    Intake/Output Summary (Last 24 hours) at 02/28/2020 1254 Last data filed at 02/28/2020 1200 Gross per 24 hour  Intake 2815.28 ml  Output 625 ml  Net 2190.28 ml   Filed Weights   02/27/20 1641  Weight: 90.7 kg    ROS: Review of Systems  Respiratory: Negative for cough and shortness of breath.   Cardiovascular: Negative for chest pain.  Gastrointestinal: Positive for abdominal pain. Negative for nausea and vomiting.  Genitourinary: Positive for dysuria.   Exam: Physical Exam HENT:     Head: Normocephalic.     Nose: No mucosal edema.     Mouth/Throat:     Pharynx: No oropharyngeal exudate.  Eyes:     General: Lids are normal.     Extraocular Movements: Extraocular movements intact.     Pupils: Pupils are equal, round, and reactive to light.  Cardiovascular:     Rate and Rhythm: Normal rate and regular rhythm.     Heart sounds: Normal heart sounds, S1 normal and S2 normal.  Pulmonary:     Breath sounds: No decreased breath sounds, wheezing, rhonchi or rales.  Abdominal:     Palpations: Abdomen is soft.     Tenderness: There is abdominal tenderness in the epigastric area.  Musculoskeletal:     Right ankle: No swelling.     Left ankle: No swelling.  Skin:    General: Skin is warm.     Findings: No rash.   Neurological:     Mental Status: He is alert and oriented to person, place, and time.       Data Reviewed: Basic Metabolic Panel: Recent Labs  Lab 02/27/20 1643 02/27/20 2026 02/27/20 2059 02/28/20 0551  NA 132*  --  130* 132*  K 3.9  --  3.4* 3.9  CL 96*  --  92* 91*  CO2 24  --  26 26  GLUCOSE 145*  --  145* 155*  BUN 14  --  13 12  CREATININE 1.02  --  0.91 0.88  CALCIUM 9.7  --  9.2 9.5  MG  --  1.8  --  1.6*  PHOS  --  3.6  --  3.2   Liver Function Tests: Recent Labs  Lab 02/27/20 1643 02/27/20 2059 02/28/20 0551  AST 22 22 22   ALT 22 22 21   ALKPHOS 46 43 44  BILITOT 1.1 1.0 1.1  PROT 8.1 7.6 7.9  ALBUMIN 4.8 4.6 4.5   Recent Labs  Lab 02/27/20 1643 02/28/20 0551  LIPASE 729* 603*   No results for input(s): AMMONIA in the last 168 hours. CBC: Recent Labs  Lab 02/27/20 1643 02/28/20 0551  WBC 15.4* 17.6*  NEUTROABS  --  16.0*  HGB 15.4 15.3  HCT 45.9  43.2  MCV 85.8 84.0  PLT 220 212   Cardiac Enzymes: Recent Labs  Lab 02/27/20 2026  CKTOTAL 67    CBG: Recent Labs  Lab 02/28/20 0018 02/28/20 0407 02/28/20 0757 02/28/20 1221  GLUCAP 163* 159* 171* 192*    Recent Results (from the past 240 hour(s))  SARS Coronavirus 2 by RT PCR (hospital order, performed in [facility] hospital lab) Nasopharyngeal Nasopharyngeal Swab     Status: None   Collection Time: 02/27/20  8:59 PM   Specimen: Nasopharyngeal Swab  Result Value Ref Range Status   SARS Coronavirus 2 NEGATIVE NEGATIVE Final    Comment: (NOTE) SARS-CoV-2 target nucleic acids are NOT DETECTED.  The SARS-CoV-2 RNA is generally detectable in upper and lower respiratory specimens during the acute phase of infection. The lowest concentration of SARS-CoV-2 viral copies this assay can detect is 250 copies / mL. A negative result does not preclude SARS-CoV-2 infection and should not be used as the sole basis for treatment or other patient management decisions.  A negative result may  occur with improper specimen collection / handling, submission of specimen other than nasopharyngeal swab, presence of viral mutation(s) within the areas targeted by this assay, and inadequate number of viral copies (<250 copies / mL). A negative result must be combined with clinical observations, patient history, and epidemiological information.  Fact Sheet for Patients:   BoilerBrush.com.cy  Fact Sheet for Healthcare Providers: https://pope.com/  This test is not yet approved or  cleared by the [place] FDA and has been authorized for detection and/or diagnosis of SARS-CoV-2 by FDA under an Emergency Use Authorization (EUA).  This EUA will remain in effect (meaning this test can be used) for the duration of the COVID-19 declaration under Section 564(b)(1) of the Act, 21 U.S.C. section 360bbb-3(b)(1), unless the authorization is terminated or revoked sooner.  Performed at [facility], [address] [place], [place] [address]      Studies: CT ABDOMEN PELVIS W CONTRAST  Result Date: 02/27/2020 CLINICAL DATA:  Acute pancreatitis, abdominal pain, pancreatic pseudocyst EXAM: CT ABDOMEN AND PELVIS WITH CONTRAST TECHNIQUE: Multidetector CT imaging of the abdomen and pelvis was performed using the standard protocol following bolus administration of intravenous contrast. CONTRAST:  OMNIPAQUE IOHEXOL 300 MG/ML  SOLN COMPARISON:  None. FINDINGS: Lower chest: Mild bibasilar atelectasis. The visualized heart and pericardium are unremarkable. Hepatobiliary: Mild hepatic steatosis. Gallbladder unremarkable. No intra or extrahepatic biliary ductal dilation. Pancreas: There is moderate peripancreatic inflammatory stranding involving the uncinate process, head, and proximal body of the pancreas in keeping with changes of acute pancreatitis. Normal enhancement of the pancreatic parenchyma. Mild peripancreatic shotty adenopathy is  likely reactive in nature. Inflammatory changes extend to involve the second portion of the duodenum without evidence of obstruction. No intraparenchymal calcifications. No peripancreatic fluid collections are identified. The pancreatic duct is not dilated. Spleen: Normal in size.  No focal intrasplenic lesions. Adrenals/Urinary Tract: The adrenal glands are unremarkable. Vascular calcifications within the lower pole of the left kidney. The kidneys are unremarkable. The bladder is moderately distended, but is otherwise unremarkable. Stomach/Bowel: Moderate stool is seen within the cecum without evidence of obstruction. The stomach, small bowel, and large bowel are otherwise unremarkable. Appendix normal. No free intraperitoneal gas or fluid. Vascular/Lymphatic: Moderate aortoiliac atherosclerotic calcification. No abdominal aortic aneurysm. 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 Reproductive: Mild prostatic enlargement. Seminal vesicles are unremarkable. Other: Rectum unremarkable. Musculoskeletal: No acute bone abnormality. IMPRESSION: Acute, uncomplicated pancreatitis involving the proximal portion of the pancreas. No evidence of biliary or pancreatic ductal obstruction. No evidence of pancreatic necrosis. No peripancreatic fluid collections identified. Aortic Atherosclerosis (ICD10-I70.0). Electronically Signed   By: Helyn Numbers MD   On: 02/27/2020 20:59    Scheduled Meds: . folic acid  1 mg Oral Daily  . insulin aspart  0-9 Units Subcutaneous Q4H  . insulin glargine  15 Units Subcutaneous QHS  . multivitamin with minerals  1 tablet Oral Daily  . nicotine  14 mg Transdermal Daily  . pneumococcal 23 valent vaccine  0.5 mL Intramuscular Tomorrow-1000  . sodium chloride flush  3 mL Intravenous Q12H  . thiamine  100 mg Oral Daily    Or  . thiamine  100 mg Intravenous Daily   Continuous Infusions: . sodium chloride 100 mL/hr at 02/28/20 0800    Assessment/Plan:  1. Acute pancreatitis.  Patient with previous history of alcohol use.  Continue IV fluids.  As needed pain and nausea medications.  Continue to trend lipase.  Start clear liquids today.  Get ultrasound of the right upper quadrant tomorrow morning. 2. 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  Continue Lantus insulin at lower dose..  Hold Metformin for right now.  Restart atorvastatin and TriCor. 3. Essential hypertension.  Blood pressure currently stable.  Restart Norvasc at lower dose. 4. Hyponatremia placed on normal saline 5. Patient also placed on alcohol withdrawal protocol just in case      Code Status:     Code Status Orders  (From admission, onward)         Start     Ordered   02/27/20 2349  Full code  Continuous        02/27/20 2348        Code Status History    This patient has a current code status but no historical code status.   Advance Care Planning Activity     Family Communication: Deferred Disposition Plan: Status is: Inpatient  Dispo: The patient is from: Home              Anticipated d/c is to: Home              Anticipated d/c date is: Likely will need a few days to treat acute pancreatitis              Patient currently will need a few days to treat acute pancreatitis.  Time spent: 27 minutes  [employer]

## 2020-02-28 NOTE — Plan of Care (Signed)
Continuing with plan of care. 

## 2020-02-28 NOTE — TOC Initial Note (Signed)
Transition of Care [facility]) - Initial/Assessment Note    Patient Details  Name: Micheal Wong MRN: [id number] Date of Birth: 02/16/1971  Transition of Care [facility] CM/SW Contact:    Candie Chroman, LCSW Phone Number: 02/28/2020, 10:13 AM  Clinical Narrative: CSW met with patient. No supports at bedside. CSW introduced role and inquired about interest in ETOH treatment resources. Patient is agreeable. CSW provided list of treatment centers in [place]. No further concerns. CSW encouraged patient to contact CSW as needed. CSW will continue to follow patient for support and facilitate return home when stable.                 Expected Discharge Plan: Home/Self Care Barriers to Discharge: Continued Medical Work up   Patient Goals and CMS Choice        Expected Discharge Plan and Services Expected Discharge Plan: Home/Self Care       Living arrangements for the past 2 months: Apartment                                      Prior Living Arrangements/Services Living arrangements for the past 2 months: Apartment   Patient language and need for interpreter reviewed:: Yes Do you feel safe going back to the place where you live?: Yes      Need for Family Participation in Patient Care: Yes (Comment)     Criminal Activity/Legal Involvement Pertinent to Current Situation/Hospitalization: No - Comment as needed  Activities of Daily Living Home Assistive Devices/Equipment: None ADL Screening (condition at time of admission) Patient's cognitive ability adequate to safely complete daily activities?: Yes Is the patient deaf or have difficulty hearing?: No Does the patient have difficulty seeing, even when wearing glasses/contacts?: No Does the patient have difficulty concentrating, remembering, or making decisions?: No Patient able to express need for assistance with ADLs?: Yes Does the patient have difficulty dressing or bathing?: No Independently performs ADLs?: Yes  (appropriate for developmental age) Does the patient have difficulty walking or climbing stairs?: No Weakness of Legs: None Weakness of Arms/Hands: None  Permission Sought/Granted                  Emotional Assessment Appearance:: Appears stated age Attitude/Demeanor/Rapport: Engaged, Gracious Affect (typically observed): Accepting, Appropriate, Calm, Pleasant Orientation: : Oriented to Self, Oriented to Place, Oriented to  Time, Oriented to Situation Alcohol / Substance Use: Alcohol Use Psych Involvement: No (comment)  Admission diagnosis:  Pancreatitis [K85.90] Epigastric pain [R10.13] Alcohol-induced acute pancreatitis, unspecified complication status [C38.37] Patient Active Problem List   Diagnosis Date Noted  . Pancreatitis 02/27/2020  . Type 2 diabetes mellitus with hyperlipidemia ([place]) 02/27/2020  . Hyperlipemia 02/27/2020  . Dehydration 02/27/2020  . Pancreatitis, recurrent 02/27/2020  . ETOH abuse 02/27/2020  . Tobacco abuse 02/27/2020  . Hypokalemia 02/27/2020  . Hyponatremia 02/27/2020   PCP:  Patient, No Pcp Per Pharmacy:   [facility] [address], [place] - [place] [address] Phone: [phone] Fax: [phone]     Social Determinants of Health (SDOH) Interventions    Readmission Risk Interventions No flowsheet data found.

## 2020-02-29 ENCOUNTER — Inpatient Hospital Stay: Payer: BC Managed Care – PPO

## 2020-02-29 DIAGNOSIS — E871 Hypo-osmolality and hyponatremia: Secondary | ICD-10-CM

## 2020-02-29 LAB — GLUCOSE, CAPILLARY
Glucose-Capillary: 142 mg/dL — ABNORMAL HIGH (ref 70–99)
Glucose-Capillary: 151 mg/dL — ABNORMAL HIGH (ref 70–99)
Glucose-Capillary: 252 mg/dL — ABNORMAL HIGH (ref 70–99)
Glucose-Capillary: 289 mg/dL — ABNORMAL HIGH (ref 70–99)

## 2020-02-29 LAB — HEMOGLOBIN A1C
Hgb A1c MFr Bld: 7 % — ABNORMAL HIGH (ref 4.8–5.6)
Mean Plasma Glucose: 154 mg/dL

## 2020-02-29 LAB — LIPASE, BLOOD: Lipase: 121 U/L — ABNORMAL HIGH (ref 11–51)

## 2020-02-29 MED ORDER — OXYCODONE HCL 5 MG PO TABS: 5.0000 mg | ORAL_TABLET | Freq: Four times a day (QID) | ORAL | 0 refills | Status: DC | PRN

## 2020-02-29 MED ORDER — ATORVASTATIN CALCIUM 10 MG PO TABS: 10.0000 mg | ORAL_TABLET | Freq: Every day | ORAL | 0 refills | Status: DC

## 2020-02-29 MED ORDER — THIAMINE HCL 100 MG PO TABS: 100.0000 mg | ORAL_TABLET | Freq: Every day | ORAL | 0 refills | Status: DC

## 2020-02-29 NOTE — Plan of Care (Signed)
Continuing with plan of care. 

## 2020-02-29 NOTE — Plan of Care (Signed)
Discharge teaching completed with patient who is in stable condition. 

## 2020-02-29 NOTE — Discharge Summary (Signed)
[facility] [place] [facility]   PATIENT NAME: Micheal Wong    MR#:  [id number]  DATE OF BIRTH:  1970-10-24  DATE OF ADMISSION:  02/27/2020 ADMITTING PHYSICIAN: Therisa Doyne, MD  DATE OF DISCHARGE: 02/29/2020  2:35 PM  PRIMARY CARE PHYSICIAN: Patient, No Pcp Per    ADMISSION DIAGNOSIS:  Pancreatitis [K85.90] Epigastric pain [R10.13] Alcohol-induced acute pancreatitis, unspecified complication status [K85.20]  DISCHARGE DIAGNOSIS:  Active Problems:   Pancreatitis   Type 2 diabetes mellitus with hyperlipidemia (HCC)   Hyperlipemia   Dehydration   Pancreatitis, recurrent   ETOH abuse   Tobacco abuse   Hypokalemia   Hyponatremia   Essential hypertension   SECONDARY DIAGNOSIS:   Past Medical History:  Diagnosis Date  . Diabetes mellitus without complication (HCC)   . Hypertension   . Pancreatitis     HOSPITAL COURSE:   1.  Acute pancreatitis.  Patient with previous history of alcohol use.  Patient was given vigorous IV fluid during the hospital course.  Lipase was 729 on admission and came down to 121 upon discharge.  Patient was feeling better and his diet was advanced.  Tolerated advance diet without worsening pain.  Patient wanted to go home.  Only a few pills of oxycodone prescribed.  Patient was advised not to drink any alcohol whatsoever. 2.  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  Continue Lantus.  Restarted atorvastatin and TriCor.  Can go back on Metformin as outpatient 3.  Essential hypertension.  Continue Norvasc 4.  Hyponatremia on normal saline 5.  History of alcohol abuse. we will give thiamine.  No signs of withdrawal during the hospital course patient states that he only drinks occasionally and does not drink like he used to.  DISCHARGE CONDITIONS:   Satisfactory  CONSULTS OBTAINED:  None  DRUG ALLERGIES:   Allergies  Allergen Reactions  . Drug Class [Ace Inhibitors] Swelling    Swelling     DISCHARGE MEDICATIONS:   Allergies as of 02/29/2020      Reactions   Drug Class [ace Inhibitors] Swelling   Swelling      Medication List    STOP taking these medications   hydrochlorothiazide 25 MG tablet Commonly known as: HYDRODIURIL     TAKE these medications   amLODipine 10 MG tablet Commonly known as: NORVASC Take 10 mg by mouth daily.   atorvastatin 10 MG tablet Commonly known as: LIPITOR Take 1 tablet (10 mg total) by mouth daily. Does not know dose   fenofibrate 48 MG tablet Commonly known as: TRICOR Take 48 mg by mouth daily. Does not know dose   insulin glargine 100 UNIT/ML injection Commonly known as: LANTUS Inject 30 Units into the skin daily.   metFORMIN 500 MG tablet Commonly known as: GLUCOPHAGE Take 500 mg by mouth daily with breakfast. Not sure of the dose   oxyCODONE 5 MG immediate release tablet Commonly known as: Oxy IR/ROXICODONE Take 1 tablet (5 mg total) by mouth every 6 (six) hours as needed for severe pain.   thiamine 100 MG tablet Take 1 tablet (100 mg total) by mouth daily. Start taking on: March 01, 2020        DISCHARGE INSTRUCTIONS:   Refer to new PMD 2 weeks  If you experience worsening of your admission symptoms, develop shortness of breath, life threatening emergency, suicidal or homicidal thoughts you must seek medical attention immediately by calling 911 or calling your MD immediately  if symptoms less severe.  You Must read complete instructions/literature along with all the possible adverse reactions/side effects for all the Medicines you take and that have been prescribed to you. Take any new Medicines after you have completely understood and accept all the possible adverse reactions/side effects.   Please note  You were cared for by a hospitalist during your hospital stay. If you have any questions about your discharge medications or the care you received while you were in the hospital after you are discharged, you  can call the unit and asked to speak with the hospitalist on call if the hospitalist that took care of you is not available. Once you are discharged, your primary care physician will handle any further medical issues. Please note that NO REFILLS for any discharge medications will be authorized once you are discharged, as it is imperative that you return to your primary care physician (or establish a relationship with a primary care physician if you do not have one) for your aftercare needs so that they can reassess your need for medications and monitor your lab values.    Today   CHIEF COMPLAINT:   Chief Complaint  Patient presents with  . Abdominal Pain    HISTORY OF PRESENT ILLNESS:  Micheal Wong  is a 49 y.o. male came in with abdominal pain found to have acute pancreatitis   VITAL SIGNS:  Blood pressure (!) 135/89, pulse 98, temperature 98.1 F (36.7 C), temperature source Oral, resp. rate 16, height 5\' 11"  (1.803 m), weight 90.7 kg, SpO2 100 %.  I/O:    Intake/Output Summary (Last 24 hours) at 02/29/2020 1614 Last data filed at 02/29/2020 1243 Gross per 24 hour  Intake 1540 ml  Output 800 ml  Net 740 ml    PHYSICAL EXAMINATION:  GENERAL:  49 y.o.-year-old patient lying in the bed with no acute distress.  EYES: Pupils equal, round, reactive to light and accommodation. No scleral icterus. HEENT: Head atraumatic, normocephalic. Oropharynx and nasopharynx clear.  LUNGS: Normal breath sounds bilaterally, no wheezing, rales,rhonchi or crepitation. No use of accessory muscles of respiration.  CARDIOVASCULAR: S1, S2 normal. No murmurs, rubs, or gallops.  ABDOMEN: Soft, slight tenderness epigastric, non-distended.  EXTREMITIES: No pedal edema, cyanosis, or clubbing.  NEUROLOGIC: Cranial nerves II through XII are intact.  PSYCHIATRIC: The patient is alert and oriented x 3.  SKIN: No obvious rash, lesion, or ulcer.   DATA REVIEW:   CBC Recent Labs  Lab 02/28/20 0551  WBC  17.6*  HGB 15.3  HCT 43.2  PLT 212    Chemistries  Recent Labs  Lab 02/28/20 0551  NA 132*  K 3.9  CL 91*  CO2 26  GLUCOSE 155*  BUN 12  CREATININE 0.88  CALCIUM 9.5  MG 1.6*  AST 22  ALT 21  ALKPHOS 44  BILITOT 1.1    Microbiology Results  Results for orders placed or performed during the hospital encounter of 02/27/20  SARS Coronavirus 2 by RT PCR (hospital order, performed in [facility] hospital lab) Nasopharyngeal Nasopharyngeal Swab     Status: None   Collection Time: 02/27/20  8:59 PM   Specimen: Nasopharyngeal Swab  Result Value Ref Range Status   SARS Coronavirus 2 NEGATIVE NEGATIVE Final    Comment: (NOTE) SARS-CoV-2 target nucleic acids are NOT DETECTED.  The SARS-CoV-2 RNA is generally detectable in upper and lower respiratory specimens during the acute phase of infection. The lowest concentration of SARS-CoV-2 viral copies this assay can detect is 250 copies / mL.  A negative result does not preclude SARS-CoV-2 infection and should not be used as the sole basis for treatment or other patient management decisions.  A negative result may occur with improper specimen collection / handling, submission of specimen other than nasopharyngeal swab, presence of viral mutation(s) within the areas targeted by this assay, and inadequate number of viral copies (<250 copies / mL). A negative result must be combined with clinical observations, patient history, and epidemiological information.  Fact Sheet for Patients:   BoilerBrush.com.cyhttps://www.fda.gov/media/136312/download  Fact Sheet for Healthcare Providers: https://pope.com/https://www.fda.gov/media/136313/download  This test is not yet approved or  cleared by the [place] FDA and has been authorized for detection and/or diagnosis of SARS-CoV-2 by FDA under an Emergency Use Authorization (EUA).  This EUA will remain in effect (meaning this test can be used) for the duration of the COVID-19 declaration under Section 564(b)(1) of the  Act, 21 U.S.C. section 360bbb-3(b)(1), unless the authorization is terminated or revoked sooner.  Performed at [facility], [address] [place], [place] [address]     RADIOLOGY:  CT ABDOMEN PELVIS W CONTRAST  Result Date: 02/27/2020 CLINICAL DATA:  Acute pancreatitis, abdominal pain, pancreatic pseudocyst EXAM: CT ABDOMEN AND PELVIS WITH CONTRAST TECHNIQUE: Multidetector CT imaging of the abdomen and pelvis was performed using the standard protocol following bolus administration of intravenous contrast. CONTRAST:  100mL OMNIPAQUE IOHEXOL 300 MG/ML  SOLN COMPARISON:  None. FINDINGS: Lower chest: Mild bibasilar atelectasis. The visualized heart and pericardium are unremarkable. Hepatobiliary: Mild hepatic steatosis. Gallbladder unremarkable. No intra or extrahepatic biliary ductal dilation. Pancreas: There is moderate peripancreatic inflammatory stranding involving the uncinate process, head, and proximal body of the pancreas in keeping with changes of acute pancreatitis. Normal enhancement of the pancreatic parenchyma. Mild peripancreatic shotty adenopathy is likely reactive in nature. Inflammatory changes extend to involve the second portion of the duodenum without evidence of obstruction. No intraparenchymal calcifications. No peripancreatic fluid collections are identified. The pancreatic duct is not dilated. Spleen: Normal in size.  No focal intrasplenic lesions. Adrenals/Urinary Tract: The adrenal glands are unremarkable. Vascular calcifications within the lower pole of the left kidney. The kidneys are unremarkable. The bladder is moderately distended, but is otherwise unremarkable. Stomach/Bowel: Moderate stool is seen within the cecum without evidence of obstruction. The stomach, small bowel, and large bowel are otherwise unremarkable. Appendix normal. No free intraperitoneal gas or fluid. Vascular/Lymphatic: Moderate aortoiliac atherosclerotic calcification. No abdominal aortic  aneurysm. 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 Reproductive: Mild prostatic enlargement. Seminal vesicles are unremarkable. Other: Rectum unremarkable. Musculoskeletal: No acute bone abnormality. IMPRESSION: Acute, uncomplicated pancreatitis involving the proximal portion of the pancreas. No evidence of biliary or pancreatic ductal obstruction. No evidence of pancreatic necrosis. No peripancreatic fluid collections identified. Aortic Atherosclerosis (ICD10-I70.0). Electronically Signed   By: Helyn NumbersAshesh  Parikh MD   On: 02/27/2020 20:59   US Abdomen Limited RUQ  Result Date: 02/29/2020 CLINICAL DATA:  Pancreatitis. EXAM: ULTRASOUND ABDOMEN LIMITED RIGHT UPPER QUADRANT COMPARISON:  None. FINDINGS: Gallbladder: No gallstones or wall thickening visualized. No sonographic Murphy sign noted by sonographer. Common bile duct: Diameter: 5.1 mm. Liver: Mild increased parenchymal echogenicity likely due to degree of steatosis without focal mass. No free fluid. Other: None. IMPRESSION: 1.  No acute findings. 2.  Suggestion of mild hepatic steatosis. Electronically Signed   By: Elberta Fortisaniel  Boyle M.D.   On:  02/29/2020 10:11      Management plans discussed with the patient, and he is in agreement.  CODE STATUS:     Code Status Orders  (From admission, onward)         Start     Ordered   02/27/20 2349  Full code  Continuous        02/27/20 2348        Code Status History    This patient has a current code status but no historical code status.   Advance Care Planning Activity      TOTAL TIME TAKING CARE OF THIS PATIENT: 34 minutes.    Alford Highland M.D on 02/29/2020 at 4:14 PM  Between 7am to 6pm - Pager - [phone]  After 6pm go to [email or web address] - password [email or web address]  [facility]  CC: Primary care physician;  Patient, No Pcp Per

## 2020-02-29 NOTE — Discharge Instructions (Signed)
Acute Pancreatitis    Acute pancreatitis happens when the pancreas gets swollen. The pancreas is a large gland in the body that helps to control blood sugar. It also makes enzymes that help to digest food.  This condition can last a few days and cause serious problems. The lungs, heart, and kidneys may stop working.  What are the causes?  Causes include:  · Alcohol abuse.  · Drug abuse.  · Gallstones.  · A tumor in the pancreas.  Other causes include:  · Some medicines.  · Some chemicals.  · Diabetes.  · An infection.  · Damage caused by an accident.  · The poison (venom) from a scorpion bite.  · Belly (abdominal) surgery.  · The body's defense system (immune system) attacking the pancreas (autoimmune pancreatitis).  · Genes that are passed from parent to child (inherited).  In some cases, the cause is not known.  What are the signs or symptoms?  · Pain in the upper belly that may be felt in the back. The pain may be very bad.  · Swelling of the belly.  · Feeling sick to your stomach (nauseous) and throwing up (vomiting).  · Fever.  How is this treated?  You will likely have to stay in the hospital. Treatment may include:  · Pain medicine.  · Fluid through an IV tube.  · Placing a tube in the stomach to take out the stomach contents. This may help you stop throwing up.  · Not eating for 3-4 days.  · Antibiotic medicines, if you have an infection.  · Treating any other problems that may be the cause.  · Steroid medicines, if your problem is caused by your defense system attacking your body's own tissues.  · Surgery.  Follow these instructions at home:  Eating and drinking    · Follow instructions from your doctor about what to eat and drink.  · Eat foods that do not have a lot of fat in them.  · Eat small meals often. Do not eat big meals.  · Drink enough fluid to keep your pee (urine) pale yellow.  · Do not drink alcohol if it caused your condition.  Medicines  · Take over-the-counter and prescription medicines only  as told by your doctor.  · Ask your doctor if the medicine prescribed to you:  ? Requires you to avoid driving or using heavy machinery.  ? Can cause trouble pooping (constipation). You may need to take steps to prevent or treat trouble pooping:  § Take over-the-counter or prescription medicines.  § Eat foods that are high in fiber. These include beans, whole grains, and fresh fruits and vegetables.  § Limit foods that are high in fat and sugar. These include fried or sweet foods.  General instructions  · Do not use any products that contain nicotine or tobacco, such as cigarettes, e-cigarettes, and chewing tobacco. If you need help quitting, ask your doctor.  · Get plenty of rest.  · Check your blood sugar at home as told by your doctor.  · Keep all follow-up visits as told by your doctor. This is important.  Contact a doctor if:  · You do not get better as quickly as expected.  · You have new symptoms.  · Your symptoms get worse.  · You have pain or weakness that lasts a long time.  · You keep feeling sick to your stomach.  · You get better and then you have pain again.  ·   You have a fever.  Get help right away if:  · You cannot eat or keep fluids down.  · Your pain gets very bad.  · Your skin or the white part of your eyes turns yellow.  · You have sudden swelling in your belly.  · You throw up.  · You feel dizzy or you pass out (faint).  · Your blood sugar is high (over 300 mg/dL).  Summary  · Acute pancreatitis happens when the pancreas gets swollen.  · This condition is often caused by alcohol abuse, drug abuse, or gallstones.  · You will likely have to stay in the hospital for treatment.  This information is not intended to replace advice given to you by your health care provider. Make sure you discuss any questions you have with your health care provider.  Document Revised: 05/14/2018 Document Reviewed: 05/14/2018  Elsevier Patient Education © 2020 [employer] Inc[employer]

## 2020-10-26 ENCOUNTER — Emergency Department: Payer: BC Managed Care – PPO

## 2020-10-26 ENCOUNTER — Other Ambulatory Visit: Payer: Self-pay

## 2020-10-26 ENCOUNTER — Inpatient Hospital Stay
Admission: EM | Admit: 2020-10-26 | Discharge: 2020-10-28 | DRG: 439 | Discharge status: Against Medical Advice | Payer: BC Managed Care – PPO | Attending: Internal Medicine | Admitting: Internal Medicine

## 2020-10-26 DIAGNOSIS — E785 Hyperlipidemia, unspecified: Secondary | ICD-10-CM | POA: Diagnosis present

## 2020-10-26 DIAGNOSIS — K852 Alcohol induced acute pancreatitis without necrosis or infection: Principal | ICD-10-CM

## 2020-10-26 DIAGNOSIS — F17210 Nicotine dependence, cigarettes, uncomplicated: Secondary | ICD-10-CM | POA: Diagnosis present

## 2020-10-26 DIAGNOSIS — Z20822 Contact with and (suspected) exposure to covid-19: Secondary | ICD-10-CM | POA: Diagnosis present

## 2020-10-26 DIAGNOSIS — I10 Essential (primary) hypertension: Secondary | ICD-10-CM | POA: Diagnosis present

## 2020-10-26 DIAGNOSIS — E872 Acidosis: Secondary | ICD-10-CM | POA: Diagnosis present

## 2020-10-26 DIAGNOSIS — I4891 Unspecified atrial fibrillation: Secondary | ICD-10-CM | POA: Diagnosis not present

## 2020-10-26 DIAGNOSIS — F1010 Alcohol abuse, uncomplicated: Secondary | ICD-10-CM | POA: Diagnosis present

## 2020-10-26 DIAGNOSIS — Z8249 Family history of ischemic heart disease and other diseases of the circulatory system: Secondary | ICD-10-CM | POA: Diagnosis not present

## 2020-10-26 DIAGNOSIS — Z888 Allergy status to other drugs, medicaments and biological substances status: Secondary | ICD-10-CM

## 2020-10-26 DIAGNOSIS — E1165 Type 2 diabetes mellitus with hyperglycemia: Secondary | ICD-10-CM | POA: Diagnosis present

## 2020-10-26 DIAGNOSIS — E860 Dehydration: Secondary | ICD-10-CM | POA: Diagnosis not present

## 2020-10-26 DIAGNOSIS — R739 Hyperglycemia, unspecified: Secondary | ICD-10-CM

## 2020-10-26 DIAGNOSIS — Z9119 Patient's noncompliance with other medical treatment and regimen: Secondary | ICD-10-CM

## 2020-10-26 DIAGNOSIS — I480 Paroxysmal atrial fibrillation: Secondary | ICD-10-CM | POA: Diagnosis present

## 2020-10-26 LAB — COMPREHENSIVE METABOLIC PANEL
ALT: 44 U/L (ref 0–44)
AST: 52 U/L — ABNORMAL HIGH (ref 15–41)
Albumin: 5 g/dL (ref 3.5–5.0)
Alkaline Phosphatase: 80 U/L (ref 38–126)
Anion gap: 19 — ABNORMAL HIGH (ref 5–15)
BUN: 15 mg/dL (ref 6–20)
CO2: 21 mmol/L — ABNORMAL LOW (ref 22–32)
Calcium: 9.6 mg/dL (ref 8.9–10.3)
Chloride: 96 mmol/L — ABNORMAL LOW (ref 98–111)
Creatinine, Ser: 1.01 mg/dL (ref 0.61–1.24)
GFR, Estimated: >60 mL/min (ref >60)
Glucose, Bld: 432 mg/dL — ABNORMAL HIGH (ref 70–99)
Potassium: 4 mmol/L (ref 3.5–5.1)
Sodium: 136 mmol/L (ref 135–145)
Total Bilirubin: 1.3 mg/dL — ABNORMAL HIGH (ref 0.3–1.2)
Total Protein: 7.9 g/dL (ref 6.5–8.1)

## 2020-10-26 LAB — CBC
HCT: 45.4 % (ref 39.0–52.0)
HCT: 44.3 % (ref 39.0–52.0)
Hemoglobin: 15.2 g/dL (ref 13.0–17.0)
Hemoglobin: 15.2 g/dL (ref 13.0–17.0)
MCH: 29.9 pg (ref 26.0–34.0)
MCH: 30.5 pg (ref 26.0–34.0)
MCHC: 33.5 g/dL (ref 30.0–36.0)
MCHC: 34.3 g/dL (ref 30.0–36.0)
MCV: 89.4 fL (ref 80.0–100.0)
MCV: 88.8 fL (ref 80.0–100.0)
Platelets: 190 10*3/uL (ref 150–400)
Platelets: 184 10*3/uL (ref 150–400)
RBC: 5.08 MIL/uL (ref 4.22–5.81)
RBC: 4.99 MIL/uL (ref 4.22–5.81)
RDW: 13.2 % (ref 11.5–15.5)
RDW: 13.4 % (ref 11.5–15.5)
WBC: 15.7 10*3/uL — ABNORMAL HIGH (ref 4.0–10.5)
WBC: 18.1 10*3/uL — ABNORMAL HIGH (ref 4.0–10.5)
nRBC: 0 % (ref 0.0–0.2)
nRBC: 0 % (ref 0.0–0.2)

## 2020-10-26 LAB — BLOOD GAS, VENOUS
Acid-base deficit: 3.3 mmol/L — ABNORMAL HIGH (ref 0.0–2.0)
Bicarbonate: 22.1 mmol/L (ref 20.0–28.0)
O2 Saturation: 85.2 %
Patient temperature: 37
pCO2, Ven: 40 mmHg — ABNORMAL LOW (ref 44.0–60.0)
pH, Ven: 7.35 (ref 7.250–7.430)
pO2, Ven: 53 mmHg — ABNORMAL HIGH (ref 32.0–45.0)

## 2020-10-26 LAB — URINALYSIS, COMPLETE (UACMP) WITH MICROSCOPIC
Bacteria, UA: NONE SEEN
Bilirubin Urine: NEGATIVE
Glucose, UA: >=500 mg/dL — AB
Ketones, ur: 20 mg/dL — AB
Leukocytes,Ua: NEGATIVE
Nitrite: NEGATIVE
Protein, ur: NEGATIVE mg/dL
Specific Gravity, Urine: >1.046 — ABNORMAL HIGH (ref 1.005–1.030)
Squamous Epithelial / HPF: NONE SEEN (ref 0–5)
pH: 5 (ref 5.0–8.0)

## 2020-10-26 LAB — GLUCOSE, CAPILLARY: Glucose-Capillary: 292 mg/dL — ABNORMAL HIGH (ref 70–99)

## 2020-10-26 LAB — CBG MONITORING, ED
Glucose-Capillary: 453 mg/dL — ABNORMAL HIGH (ref 70–99)
Glucose-Capillary: 301 mg/dL — ABNORMAL HIGH (ref 70–99)

## 2020-10-26 LAB — LIPASE, BLOOD: Lipase: 363 U/L — ABNORMAL HIGH (ref 11–51)

## 2020-10-26 LAB — RESP PANEL BY RT-PCR (FLU A&B, COVID) ARPGX2
Influenza A by PCR: NEGATIVE
Influenza B by PCR: NEGATIVE
SARS Coronavirus 2 by RT PCR: NEGATIVE

## 2020-10-26 LAB — ETHANOL: Alcohol, Ethyl (B): <10 mg/dL (ref <10)

## 2020-10-26 LAB — PHOSPHORUS: Phosphorus: 3.9 mg/dL (ref 2.5–4.6)

## 2020-10-26 LAB — MAGNESIUM: Magnesium: 1.6 mg/dL — ABNORMAL LOW (ref 1.7–2.4)

## 2020-10-26 MED ORDER — ADULT MULTIVITAMIN W/MINERALS CH
1.0000 | ORAL_TABLET | Freq: Every day | ORAL | Status: DC
  Administered 2020-10-26 – 2020-10-28 (×2): 1 via ORAL
  Filled 2020-10-26 (×3): qty 1

## 2020-10-26 MED ORDER — LORAZEPAM 2 MG/ML IJ SOLN
1.0000 mg | INTRAMUSCULAR | Status: DC | PRN
  Administered 2020-10-27: 2 mg via INTRAVENOUS
  Filled 2020-10-26: qty 1

## 2020-10-26 MED ORDER — DILTIAZEM HCL 25 MG/5ML IV SOLN: 10.0000 mg | Freq: Once | INTRAVENOUS | Status: AC

## 2020-10-26 MED ORDER — NICOTINE 14 MG/24HR TD PT24
14.0000 mg | MEDICATED_PATCH | Freq: Every day | TRANSDERMAL | Status: DC
  Administered 2020-10-26 – 2020-10-28 (×2): 14 mg via TRANSDERMAL
  Filled 2020-10-26 (×2): qty 1

## 2020-10-26 MED ORDER — NALOXONE HCL 2 MG/2ML IJ SOSY
0.4000 mg | PREFILLED_SYRINGE | INTRAMUSCULAR | Status: DC | PRN
  Filled 2020-10-26: qty 2

## 2020-10-26 MED ORDER — INSULIN ASPART 100 UNIT/ML ~~LOC~~ SOLN
0.0000 [IU] | SUBCUTANEOUS | Status: DC
  Administered 2020-10-26: 11 [IU] via SUBCUTANEOUS
  Administered 2020-10-27: 5 [IU] via SUBCUTANEOUS
  Administered 2020-10-27: 3 [IU] via SUBCUTANEOUS
  Administered 2020-10-27 (×2): 5 [IU] via SUBCUTANEOUS
  Administered 2020-10-27: 8 [IU] via SUBCUTANEOUS
  Administered 2020-10-27: 3 [IU] via SUBCUTANEOUS
  Administered 2020-10-27 – 2020-10-28 (×2): 8 [IU] via SUBCUTANEOUS
  Administered 2020-10-28: 3 [IU] via SUBCUTANEOUS
  Filled 2020-10-26 (×10): qty 1

## 2020-10-26 MED ORDER — LORAZEPAM 2 MG/ML IJ SOLN: 2.0000 mg | Freq: Once | INTRAMUSCULAR | Status: DC

## 2020-10-26 MED ORDER — IOHEXOL 300 MG/ML  SOLN
100.0000 mL | Freq: Once | INTRAMUSCULAR | Status: AC | PRN
  Administered 2020-10-26: 100 mL via INTRAVENOUS

## 2020-10-26 MED ORDER — ENOXAPARIN SODIUM 100 MG/ML ~~LOC~~ SOLN
1.0000 mg/kg | Freq: Two times a day (BID) | SUBCUTANEOUS | Status: DC
  Administered 2020-10-26 – 2020-10-28 (×4): 92.5 mg via SUBCUTANEOUS
  Filled 2020-10-26 (×5): qty 1

## 2020-10-26 MED ORDER — LACTATED RINGERS IV BOLUS
1000.0000 mL | Freq: Once | INTRAVENOUS | Status: AC
  Administered 2020-10-26: 1000 mL via INTRAVENOUS

## 2020-10-26 MED ORDER — HYDROMORPHONE HCL 1 MG/ML IJ SOLN
1.0000 mg | Freq: Once | INTRAMUSCULAR | Status: AC
  Administered 2020-10-26: 1 mg via INTRAVENOUS
  Filled 2020-10-26: qty 1

## 2020-10-26 MED ORDER — LORAZEPAM 2 MG/ML IJ SOLN
1.0000 mg | Freq: Once | INTRAMUSCULAR | Status: AC
  Administered 2020-10-26: 1 mg via INTRAVENOUS
  Filled 2020-10-26: qty 1

## 2020-10-26 MED ORDER — LACTATED RINGERS IV SOLN: INTRAVENOUS | Status: DC

## 2020-10-26 MED ORDER — THIAMINE HCL 100 MG/ML IJ SOLN: 100.0000 mg | Freq: Every day | INTRAMUSCULAR | Status: DC

## 2020-10-26 MED ORDER — LACTATED RINGERS IV BOLUS: 1000.0000 mL | Freq: Once | INTRAVENOUS | Status: DC

## 2020-10-26 MED ORDER — INSULIN ASPART 100 UNIT/ML ~~LOC~~ SOLN
9.0000 [IU] | Freq: Once | SUBCUTANEOUS | Status: AC
  Administered 2020-10-26: 9 [IU] via INTRAVENOUS
  Filled 2020-10-26: qty 1

## 2020-10-26 MED ORDER — LORAZEPAM 1 MG PO TABS: 1.0000 mg | ORAL_TABLET | ORAL | Status: DC | PRN

## 2020-10-26 MED ORDER — SODIUM CHLORIDE 0.9 % IV BOLUS: 1000.0000 mL | Freq: Once | INTRAVENOUS | Status: DC

## 2020-10-26 MED ORDER — DILTIAZEM HCL 25 MG/5ML IV SOLN
INTRAVENOUS | Status: AC
  Administered 2020-10-26: 10 mg via INTRAVENOUS
  Filled 2020-10-26: qty 5

## 2020-10-26 MED ORDER — THIAMINE HCL 100 MG PO TABS
100.0000 mg | ORAL_TABLET | Freq: Every day | ORAL | Status: DC
  Administered 2020-10-26 – 2020-10-28 (×2): 100 mg via ORAL
  Filled 2020-10-26 (×3): qty 1

## 2020-10-26 MED ORDER — METOPROLOL TARTRATE 25 MG PO TABS: 25.0000 mg | ORAL_TABLET | Freq: Two times a day (BID) | ORAL | Status: DC

## 2020-10-26 MED ORDER — METOPROLOL TARTRATE 5 MG/5ML IV SOLN: 5.0000 mg | Freq: Four times a day (QID) | INTRAVENOUS | Status: DC | PRN

## 2020-10-26 MED ORDER — ONDANSETRON HCL 4 MG/2ML IJ SOLN
4.0000 mg | Freq: Once | INTRAMUSCULAR | Status: AC
  Administered 2020-10-26: 4 mg via INTRAVENOUS
  Filled 2020-10-26: qty 2

## 2020-10-26 MED ORDER — METOPROLOL TARTRATE 25 MG PO TABS
25.0000 mg | ORAL_TABLET | Freq: Two times a day (BID) | ORAL | Status: DC
Start: 2020-10-26 — End: 2020-10-28
  Administered 2020-10-26 – 2020-10-28 (×4): 25 mg via ORAL
  Filled 2020-10-26 (×4): qty 1

## 2020-10-26 MED ORDER — DIAZEPAM 5 MG PO TABS
10.0000 mg | ORAL_TABLET | Freq: Three times a day (TID) | ORAL | Status: DC
  Administered 2020-10-26 – 2020-10-28 (×5): 10 mg via ORAL
  Filled 2020-10-26 (×5): qty 2

## 2020-10-26 MED ORDER — HYDROMORPHONE HCL 1 MG/ML IJ SOLN: 1.0000 mg | Freq: Once | INTRAMUSCULAR | Status: DC

## 2020-10-26 MED ORDER — MAGNESIUM SULFATE 2 GM/50ML IV SOLN
2.0000 g | Freq: Once | INTRAVENOUS | Status: AC
  Administered 2020-10-26: 2 g via INTRAVENOUS
  Filled 2020-10-26: qty 50

## 2020-10-26 MED ORDER — FOLIC ACID 1 MG PO TABS
1.0000 mg | ORAL_TABLET | Freq: Every day | ORAL | Status: DC
  Administered 2020-10-26 – 2020-10-28 (×2): 1 mg via ORAL
  Filled 2020-10-26 (×3): qty 1

## 2020-10-26 MED ORDER — PROCHLORPERAZINE EDISYLATE 10 MG/2ML IJ SOLN
10.0000 mg | Freq: Four times a day (QID) | INTRAMUSCULAR | Status: DC | PRN
  Filled 2020-10-26: qty 2

## 2020-10-26 MED ORDER — THIAMINE HCL 100 MG/ML IJ SOLN: 100.0000 mg | Freq: Once | INTRAMUSCULAR | Status: DC

## 2020-10-26 MED ORDER — DILTIAZEM HCL-DEXTROSE 125-5 MG/125ML-% IV SOLN (PREMIX)
5.0000 mg/h | INTRAVENOUS | Status: DC
  Administered 2020-10-26 – 2020-10-27 (×2): 5 mg/h via INTRAVENOUS
  Filled 2020-10-26 (×2): qty 125

## 2020-10-26 MED ORDER — HYDROMORPHONE HCL 1 MG/ML IJ SOLN
0.5000 mg | INTRAMUSCULAR | Status: DC | PRN
  Administered 2020-10-27 (×2): 0.5 mg via INTRAVENOUS
  Filled 2020-10-26 (×2): qty 1

## 2020-10-26 NOTE — Consult Note (Signed)
ANTICOAGULATION CONSULT NOTE  Pharmacy Consult for Lovenox Indication: atrial fibrillation  Allergies  Allergen Reactions  . Drug Class [Ace Inhibitors] Swelling    Swelling    Patient Measurements: Height: 5\' 11"  (180.3 cm) Weight: 93 kg (205 lb) IBW/kg (Calculated) : 75.3  Vital Signs: Temp: 97.5 F (36.4 C) (03/21 1536) Temp Source: Oral (03/21 1536) BP: 169/87 (03/21 2121) Pulse Rate: 149 (03/21 2121)  Labs: Recent Labs    10/26/20 1540  HGB 15.2  HCT 45.4  PLT 190  CREATININE 1.01    Estimated Creatinine Clearance: 103.1 mL/min (by C-G formula based on SCr of 1.01 mg/dL).   Medical History: Past Medical History:  Diagnosis Date  . Diabetes mellitus without complication (HCC)   . Hypertension   . Pancreatitis     Medications:  No PTA AC or APT  Assessment: 50 y.o. male with medical history significant for alcohol abuse, prior history of alcoholic pancreatitis, T2DM, HTN, and HLD who presented to [facility] ED due to worsening nausea, vomiting, and abdominal pain of 1 day duration. Patient was noted to be in A. fib with RVR for which he received a loading dose of Cardizem and started on Cardizem drip. CHA2DS2-VASc score 2. Pharmacy has been consulted for Lovenox dosing.  H/H and plts WNL  Goal of Therapy:  Monitor platelets by anticoagulation protocol: Yes   Plan:  Lovenox 92.5 mg (1 mg/kg) every 12 hours Monitor daily CBC, s/s of bleed   [facility], PharmD 10/26/2020,9:23 PM

## 2020-10-26 NOTE — ED Provider Notes (Signed)
d  [facility] Emergency Department Provider Note  ____________________________________________   Event Date/Time   First MD Initiated Contact with Patient 10/26/20 1744     (approximate)  I have reviewed the triage vital signs and the nursing notes.   HISTORY  Chief Complaint Abdominal Pain    HPI Micheal Wong is a 50 y.o. male  Here with abdominal pain, vomitng. Pt reports that he ate a large meal and had some alcohol several days ago, over the weekend. Since then, he's had worsening abdominal pain that is 10/10, aching, gnawing, severe and worse with any attempt at eating. He's had associated nausea, vomiting. No diarrhea. He feels like his abdomen is distended. Reports that sx feel similar to his prior pancreatitis episodes. No fevers. No diarrhea, constipation. Pain is worse w/ eating, no alleviating factors.        Past Medical History:  Diagnosis Date  . Diabetes mellitus without complication (HCC)   . Hypertension   . Pancreatitis     Patient Active Problem List   Diagnosis Date Noted  . Acute alcoholic pancreatitis 10/26/2020  . Essential hypertension   . Pancreatitis 02/27/2020  . Type 2 diabetes mellitus with hyperlipidemia (HCC) 02/27/2020  . Hyperlipemia 02/27/2020  . Dehydration 02/27/2020  . Pancreatitis, recurrent 02/27/2020  . ETOH abuse 02/27/2020  . Tobacco abuse 02/27/2020  . Hypokalemia 02/27/2020  . Hyponatremia 02/27/2020    History reviewed. No pertinent surgical history.  Prior to Admission medications   Medication Sig Start Date End Date Taking? Authorizing Provider  amLODipine (NORVASC) 10 MG tablet Take 10 mg by mouth daily.    [provider]  atorvastatin (LIPITOR) 10 MG tablet Take 1 tablet (10 mg total) by mouth daily. Does not know dose 02/29/20   Alford Highland, MD  fenofibrate (TRICOR) 48 MG tablet Take 48 mg by mouth daily. Does not know dose    [provider]  insulin glargine  (LANTUS) 100 UNIT/ML injection Inject 30 Units into the skin daily.    [provider]  metFORMIN (GLUCOPHAGE) 500 MG tablet Take 500 mg by mouth daily with breakfast. Not sure of the dose    [provider]  oxyCODONE (OXY IR/ROXICODONE) 5 MG immediate release tablet Take 1 tablet (5 mg total) by mouth every 6 (six) hours as needed for severe pain. 02/29/20   Alford Highland, MD  thiamine 100 MG tablet Take 1 tablet (100 mg total) by mouth daily. 03/01/20   Alford Highland, MD    Allergies Drug class [ace inhibitors]  No family history on file.  Social History Social History   Tobacco Use  . Smoking status: Current Every Day Smoker    Packs/day: 0.50    Types: Cigarettes  . Smokeless tobacco: Never Used  Substance Use Topics  . Alcohol use: Yes    Comment: occasionally  . Drug use: Not Currently    Review of Systems  Review of Systems  Constitutional: Negative for chills, fatigue and fever.  HENT: Negative for sore throat.   Respiratory: Negative for shortness of breath.   Cardiovascular: Negative for chest pain.  Gastrointestinal: Positive for abdominal pain, nausea and vomiting.  Genitourinary: Negative for flank pain.  Musculoskeletal: Negative for neck pain.  Skin: Negative for rash and wound.  Allergic/Immunologic: Negative for immunocompromised state.  Neurological: Negative for weakness and numbness.  Hematological: Does not bruise/bleed easily.     ____________________________________________  PHYSICAL EXAM:      VITAL SIGNS: ED Triage Vitals  Enc Vitals Group     BP 10/26/20 1536 (!) 139/109     Pulse Rate 10/26/20 1536 (!) 109     Resp 10/26/20 1536 18     Temp 10/26/20 1536 (!) 97.5 F (36.4 C)     Temp Source 10/26/20 1536 Oral     SpO2 10/26/20 1536 100 %     Weight 10/26/20 1538 205 lb (93 kg)     Height 10/26/20 1538 5\' 11"  (1.803 m)     Head Circumference --      Peak Flow --      Pain Score 10/26/20 1538 10     Pain Loc --       Pain Edu? --      Excl. in GC? --      Physical Exam Vitals and nursing note reviewed.  Constitutional:      General: He is not in acute distress.    Appearance: He is well-developed.     Comments: Mildly dry MM  HENT:     Head: Normocephalic and atraumatic.  Eyes:     Conjunctiva/sclera: Conjunctivae normal.  Cardiovascular:     Rate and Rhythm: Normal rate and regular rhythm.     Heart sounds: Normal heart sounds. No murmur heard. No friction rub.  Pulmonary:     Effort: Pulmonary effort is normal. No respiratory distress.     Breath sounds: Normal breath sounds. No wheezing or rales.  Abdominal:     General: There is no distension.     Palpations: Abdomen is soft.     Tenderness: There is abdominal tenderness in the epigastric area. There is guarding. There is no right CVA tenderness, left CVA tenderness or rebound.  Musculoskeletal:     Cervical back: Neck supple.  Skin:    General: Skin is warm.     Capillary Refill: Capillary refill takes less than 2 seconds.  Neurological:     Mental Status: He is alert and oriented to person, place, and time.     Motor: No abnormal muscle tone.       ____________________________________________   LABS (all labs ordered are listed, but only abnormal results are displayed)  Labs Reviewed  LIPASE, BLOOD - Abnormal; Notable for the following components:      Result Value   Lipase 363 (*)    All other components within normal limits  COMPREHENSIVE METABOLIC PANEL - Abnormal; Notable for the following components:   Chloride 96 (*)    CO2 21 (*)    Glucose, Bld 432 (*)    AST 52 (*)    Total Bilirubin 1.3 (*)    Anion gap 19 (*)    All other components within normal limits  CBC - Abnormal; Notable for the following components:   WBC 15.7 (*)    All other components within normal limits  URINALYSIS, COMPLETE (UACMP) WITH MICROSCOPIC - Abnormal; Notable for the following components:   Color, Urine STRAW (*)     APPearance CLEAR (*)    Specific Gravity, Urine >1.046 (*)    Glucose, UA >=500 (*)    Hgb urine dipstick SMALL (*)    Ketones, ur 20 (*)    All other components within normal limits  BLOOD GAS, VENOUS - Abnormal; Notable for the following components:   pCO2, Ven 40 (*)    pO2, Ven 53.0 (*)    Acid-base deficit 3.3 (*)    All other components within normal limits  MAGNESIUM - Abnormal; Notable for  the following components:   Magnesium 1.6 (*)    All other components within normal limits  CBC - Abnormal; Notable for the following components:   WBC 18.1 (*)    All other components within normal limits  CBG MONITORING, ED - Abnormal; Notable for the following components:   Glucose-Capillary 453 (*)    All other components within normal limits  CBG MONITORING, ED - Abnormal; Notable for the following components:   Glucose-Capillary 301 (*)    All other components within normal limits  RESP PANEL BY RT-PCR (FLU A&B, COVID) ARPGX2  ETHANOL  PHOSPHORUS  HEMOGLOBIN A1C  CBC WITH DIFFERENTIAL/PLATELET  COMPREHENSIVE METABOLIC PANEL  MAGNESIUM  PHOSPHORUS    ____________________________________________  EKG: Atrial fibrillation, VR 139. QRS 118, QTc 478. No acute ST elevations, nonspecific repol abnormality noted. ________________________________________  RADIOLOGY All imaging, including plain films, CT scans, and ultrasounds, independently reviewed by me, and interpretations confirmed via formal radiology reads.  ED MD interpretation:   CT: Inflammatory changes around pancreatic head and uncinate  Official radiology report(s): CT ABDOMEN PELVIS W CONTRAST  Result Date: 10/26/2020 CLINICAL DATA:  Abdominal pain, nausea, vomiting EXAM: CT ABDOMEN AND PELVIS WITH CONTRAST TECHNIQUE: Multidetector CT imaging of the abdomen and pelvis was performed using the standard protocol following bolus administration of intravenous contrast. CONTRAST:  OMNIPAQUE IOHEXOL 300 MG/ML  SOLN  COMPARISON:  02/27/2020 FINDINGS: Lower chest: No acute abnormality Hepatobiliary: Diffuse low-density throughout the liver compatible with fatty infiltration. No focal abnormality. Gallbladder unremarkable. Pancreas: Inflammation/stranding noted around the pancreas, most notable in the pancreatic head and uncinate process region compatible with acute pancreatitis. No ductal dilatation. Spleen: No focal abnormality.  Normal size. Adrenals/Urinary Tract: No adrenal abnormality. No focal renal abnormality. No stones or hydronephrosis. Urinary bladder is unremarkable. Stomach/Bowel: Normal appendix. Stomach, large and small bowel grossly unremarkable. Vascular/Lymphatic: Heavily calcified aorta and iliac vessels. No evidence of aneurysm or adenopathy. Reproductive: No visible focal abnormality. Other: No free fluid or free air. Musculoskeletal: No acute bony abnormality. IMPRESSION: Inflammatory changes around the pancreatic head and uncinate process compatible with acute pancreatitis. Hepatic steatosis. Aortoiliac atherosclerosis. Electronically Signed   By: Charlett Nose M.D.   On: 10/26/2020 18:47    ____________________________________________  PROCEDURES   Procedure(s) performed (including Critical Care):  .1-3 Lead EKG Interpretation Performed by: Shaune Pollack, MD Authorized by: Shaune Pollack, MD     Interpretation: non-specific     ECG rate:  120-160   ECG rate assessment: tachycardic     Rhythm: atrial fibrillation     Ectopy: none     Conduction: normal   Comments:     Indication: Abdominal pain, nausea .Critical Care Performed by: Shaune Pollack, MD Authorized by: Shaune Pollack, MD   Critical care provider statement:    Critical care time (minutes):  35   Critical care time was exclusive of:  Separately billable procedures and treating other patients and teaching time   Critical care was necessary to treat or prevent imminent or life-threatening deterioration of the  following conditions:  Circulatory failure, cardiac failure and respiratory failure   Critical care was time spent personally by me on the following activities:  Development of treatment plan with patient or surrogate, discussions with consultants, evaluation of patient's response to treatment, examination of patient, obtaining history from patient or surrogate, ordering and performing treatments and interventions, ordering and review of laboratory studies, ordering and review of radiographic studies, pulse oximetry, re-evaluation of patient's condition and review of old charts  I assumed direction of critical care for this patient from another provider in my specialty: no      ____________________________________________  INITIAL IMPRESSION / MDM / ASSESSMENT AND PLAN / ED COURSE  As part of my medical decision making, I reviewed the following data within the electronic MEDICAL RECORD NUMBER Nursing notes reviewed and incorporated, Old chart reviewed, Notes from prior ED visits, and [place] Controlled Substance Database       *Micheal Wong was evaluated in Emergency Department on 10/26/2020 for the symptoms described in the history of present illness. He was evaluated in the context of the global COVID-19 pandemic, which necessitated consideration that the patient might be at risk for infection with the SARS-CoV-2 virus that causes COVID-19. Institutional protocols and algorithms that pertain to the evaluation of patients at risk for COVID-19 are in a state of rapid change based on information released by regulatory bodies including the CDC and federal and state organizations. These policies and algorithms were followed during the patient's care in the ED.  Some ED evaluations and interventions may be delayed as a result of limited staffing during the pandemic.*     Medical Decision Making:  51 yo M here with abdominal pain, nausea, vomiting. Labs reviewed, show significant dehydration, hyperglycemia, and  elevated lipase consistent with pancreatitis. CMP shows CO2 21, Glu 432 and AG is slightly elevated, but I suspect this is 2/2 combination of vomiting/dehydraiton and alcoholic ketoacidosis, rather than DKA. PH is normal. CBC with likely reactive leukocytosis. CT scan shows acute pancreatitis, no complications. Will start fluids, antiemetics, plan to admit. IV insulin given for hyperglycemia.  While in ED, pt noted to have significant tachycardia. Repeat EKG shows AFib RVR. No known history of this. Dr. Sedalia Muta notified, will admit to step down. IV Dilt started.  ____________________________________________  FINAL CLINICAL IMPRESSION(S) / ED DIAGNOSES  Final diagnoses:  Alcohol-induced acute pancreatitis without infection or necrosis  Hyperglycemia  Dehydration  Atrial fibrillation with rapid ventricular response (HCC)     MEDICATIONS GIVEN DURING THIS VISIT:  Medications  LORazepam (ATIVAN) tablet 1-4 mg (has no administration in time range)    Or  LORazepam (ATIVAN) injection 1-4 mg (has no administration in time range)  thiamine tablet 100 mg (100 mg Oral Given 10/26/20 2122)    Or  thiamine (B-1) injection 100 mg ( Intravenous See Alternative 10/26/20 2122)  folic acid (FOLVITE) tablet 1 mg (1 mg Oral Given 10/26/20 2122)  multivitamin with minerals tablet 1 tablet (1 tablet Oral Given 10/26/20 2124)  prochlorperazine (COMPAZINE) injection 10 mg (has no administration in time range)  lactated ringers infusion ( Intravenous Infusion Verify 10/26/20 2211)  insulin aspart (novoLOG) injection 0-15 Units (11 Units Subcutaneous Given 10/26/20 2112)  HYDROmorphone (DILAUDID) injection 0.5 mg (has no administration in time range)  naloxone [facility]) injection 0.4 mg (has no administration in time range)  diltiazem (CARDIZEM) 125 mg in dextrose 5% 125 mL (1 mg/mL) infusion (7.5 mg/hr Intravenous Infusion Verify 10/26/20 2211)  diazepam (VALIUM) tablet 10 mg (10 mg Oral Given 10/26/20 2222)  nicotine  (NICODERM CQ - dosed in mg/24 hours) patch 14 mg (14 mg Transdermal Patch Applied 10/26/20 2228)  metoprolol tartrate (LOPRESSOR) injection 5 mg (has no administration in time range)  metoprolol tartrate (LOPRESSOR) tablet 25 mg (25 mg Oral Given 10/26/20 2223)  enoxaparin (LOVENOX) injection 92.5 mg (92.5 mg Subcutaneous Given 10/26/20 2222)  magnesium sulfate IVPB 2 g 50 mL (has no administration in time range)  HYDROmorphone (DILAUDID) injection 1  mg (1 mg Intravenous Given 10/26/20 1757)  ondansetron (ZOFRAN) injection 4 mg (4 mg Intravenous Given 10/26/20 1759)  lactated ringers bolus 1,000 mL (1,000 mLs Intravenous New Bag/Given 10/26/20 1835)  insulin aspart (novoLOG) injection 9 Units (9 Units Intravenous Given 10/26/20 1836)  iohexol (OMNIPAQUE) 300 MG/ML solution 100 mL (100 mLs Intravenous Contrast Given 10/26/20 1819)  LORazepam (ATIVAN) injection 1 mg (1 mg Intravenous Given 10/26/20 2104)  diltiazem (CARDIZEM) injection 10 mg (10 mg Intravenous Given 10/26/20 2033)     ED Discharge Orders    None       Note:  This document was prepared using Dragon voice recognition software and may include unintentional dictation errors.   Shaune PollackIsaacs, Bona Hubbard, MD 10/26/20 2259

## 2020-10-26 NOTE — ED Notes (Signed)
Report provided to [facility], Charity fundraiser. Pt up with Lorre Nick, RN.

## 2020-10-26 NOTE — ED Triage Notes (Signed)
Pt states "my pancreatitis is flared up". States he has a hx of pancreatitis and has been having abd pain with nausea since yesterday

## 2020-10-26 NOTE — ED Notes (Signed)
EKG performed and given to Issacs MD who gave verbal order for cardizem 10mg  IV.

## 2020-10-26 NOTE — H&P (Addendum)
History and Physical  Micheal Wong SWF:[id number] DOB: 11/22/1970 DOA: 10/26/2020  Referring physician: Dr. Erma Heritage, EDP PCP: Patient, No Pcp Per  Outpatient Specialists: None Patient coming from: Home  Chief Complaint: Severe abdominal pain with nausea and vomiting.  HPI: Micheal Wong is a 50 y.o. male with medical history significant for alcohol abuse, prior history of alcoholic pancreatitis for which she was hospitalized in July 2021, tobacco use disorder, type 2 diabetes, essential hypertension, hyperlipidemia who presented to [facility] ED due to worsening nausea, vomiting, and abdominal pain of 1 day duration.  The pain is located in his epigastric region and nonradiating.  Last alcohol intake was 2-3 days ago.  States the pain was so severe during lunch time that he decided to present to the ED for further evaluation.  Denies any chest pain, dyspnea or palpitations.  While in the ED he had evidence of acute pancreatitis on CT scan with elevated lipase level.  New A. fib with RVR for which he received a loading dose of Cardizem and started on Cardizem drip.  CHA2DS2-VASc score 2, started on heparin drip.  ED Course: Temperature 97.5, BP 195/100, pulse 142, respiration rate 31, O2 saturation 98% on room air.  Lab studies remarkable for WBC 15.7K, hemoglobin 15.2K, platelet 190 K.  Serum glucose 432, serum bicarb 21, anion gap 19, lipase 363, total bilirubin 1.3, AST 52.  Alcohol level less than 10.  Review of Systems: Review of systems as noted in the HPI. All other systems reviewed and are negative.   Past Medical History:  Diagnosis Date  . Diabetes mellitus without complication (HCC)   . Hypertension   . Pancreatitis    History reviewed. No pertinent surgical history.  Social History:  reports that he has been smoking cigarettes. He has been smoking about 0.50 packs per day. He has never used smokeless tobacco. He reports current alcohol use. He reports previous drug use.   Allergies   Allergen Reactions  . Drug Class [Ace Inhibitors] Swelling    Swelling   Family history: Mother with history of heart disease.  Prior to Admission medications   Medication Sig Start Date End Date Taking? Authorizing Provider  amLODipine (NORVASC) 10 MG tablet Take 10 mg by mouth daily.    [provider]  atorvastatin (LIPITOR) 10 MG tablet Take 1 tablet (10 mg total) by mouth daily. Does not know dose 02/29/20   Alford Highland, MD  fenofibrate (TRICOR) 48 MG tablet Take 48 mg by mouth daily. Does not know dose    [provider]  insulin glargine (LANTUS) 100 UNIT/ML injection Inject 30 Units into the skin daily.    [provider]  metFORMIN (GLUCOPHAGE) 500 MG tablet Take 500 mg by mouth daily with breakfast. Not sure of the dose    [provider]  oxyCODONE (OXY IR/ROXICODONE) 5 MG immediate release tablet Take 1 tablet (5 mg total) by mouth every 6 (six) hours as needed for severe pain. 02/29/20   Alford Highland, MD  thiamine 100 MG tablet Take 1 tablet (100 mg total) by mouth daily. 03/01/20   Alford Highland, MD    Physical Exam: BP (!) 196/126   Pulse (!) 153   Temp (!) 97.5 F (36.4 C) (Oral)   Resp 20   Ht 5\' 11"  (1.803 m)   Wt 93 kg   SpO2 99%   BMI 28.59 kg/m   . General: 50 y.o. year-old male well developed well nourished in no acute distress.  Alert  and oriented x3. . Cardiovascular: Tachycardic, irregular rate and rhythm with no rubs or gallops.  No thyromegaly or JVD noted.  No lower extremity edema. 2/4 pulses in all 4 extremities. Marland Kitchen Respiratory: Clear to auscultation with no wheezes or rales. Good inspiratory effort. . Abdomen: Obese mildly distended with tenderness with palpation at epigastric region.  Normal bowel sounds x4 quadrants. . Muskuloskeletal: No cyanosis, clubbing or edema noted bilaterally . Neuro: CN II-XII intact, strength, sensation, reflexes . Skin: No ulcerative lesions noted or rashes . Psychiatry:  Judgement and insight appear normal. Mood is appropriate for condition and setting          Labs on Admission:  Basic Metabolic Panel: Recent Labs  Lab 10/26/20 1540  NA 136  K 4.0  CL 96*  CO2 21*  GLUCOSE 432*  BUN 15  CREATININE 1.01  CALCIUM 9.6   Liver Function Tests: Recent Labs  Lab 10/26/20 1540  AST 52*  ALT 44  ALKPHOS 80  BILITOT 1.3*  PROT 7.9  ALBUMIN 5.0   Recent Labs  Lab 10/26/20 1540  LIPASE 363*   No results for input(s): AMMONIA in the last 168 hours. CBC: Recent Labs  Lab 10/26/20 1540  WBC 15.7*  HGB 15.2  HCT 45.4  MCV 89.4  PLT 190   Cardiac Enzymes: No results for input(s): CKTOTAL, CKMB, CKMBINDEX, TROPONINI in the last 168 hours.  BNP (last 3 results) No results for input(s): BNP in the last 8760 hours.  ProBNP (last 3 results) No results for input(s): PROBNP in the last 8760 hours.  CBG: Recent Labs  Lab 10/26/20 1815  GLUCAP 453*    Radiological Exams on Admission: CT ABDOMEN PELVIS W CONTRAST  Result Date: 10/26/2020 CLINICAL DATA:  Abdominal pain, nausea, vomiting EXAM: CT ABDOMEN AND PELVIS WITH CONTRAST TECHNIQUE: Multidetector CT imaging of the abdomen and pelvis was performed using the standard protocol following bolus administration of intravenous contrast. CONTRAST:  OMNIPAQUE IOHEXOL 300 MG/ML  SOLN COMPARISON:  02/27/2020 FINDINGS: Lower chest: No acute abnormality Hepatobiliary: Diffuse low-density throughout the liver compatible with fatty infiltration. No focal abnormality. Gallbladder unremarkable. Pancreas: Inflammation/stranding noted around the pancreas, most notable in the pancreatic head and uncinate process region compatible with acute pancreatitis. No ductal dilatation. Spleen: No focal abnormality.  Normal size. Adrenals/Urinary Tract: No adrenal abnormality. No focal renal abnormality. No stones or hydronephrosis. Urinary bladder is unremarkable. Stomach/Bowel: Normal appendix. Stomach, large and  small bowel grossly unremarkable. Vascular/Lymphatic: Heavily calcified aorta and iliac vessels. No evidence of aneurysm or adenopathy. Reproductive: No visible focal abnormality. Other: No free fluid or free air. Musculoskeletal: No acute bony abnormality. IMPRESSION: Inflammatory changes around the pancreatic head and uncinate process compatible with acute pancreatitis. Hepatic steatosis. Aortoiliac atherosclerosis. Electronically Signed   By: Charlett Nose M.D.   On: 10/26/2020 18:47    EKG: I independently viewed the EKG done and my findings are as followed: A. fib with RVR rate of 142.  Nonspecific ST-T changes.  Assessment/Plan Present on Admission: . Acute alcoholic pancreatitis  Active Problems:   Acute alcoholic pancreatitis  Acute alcohol pancreatitis Prior admission for the same in July 2021. CT evidence of acute pancreatitis and elevated lipase level.   No evidence of ductal dilatation, calcification, or necrosis on CT scan. N.p.o. except for sips and meds Aggressive IV fluid hydration Pain control Alcohol cessation counseling. Repeat CMP and lipase level in the morning.  New A. fib with RVR States 6 years ago he was told to  wear a Holter monitor in [place], did not follow-up. CHA2DS2-VASc of 2 Received a note of Cardizem, started Cardizem drip, continue Started on heparin drip for primary CVA prevention Obtain 2D echo, follow results. Consult cardiology in the morning.  Type 2 diabetes with hyperglycemia Obtain hemoglobin A1c He takes Lantus 30 units nightly and Humalog 25 to 30 units 3 times daily. Currently n.p.o. due to acute pancreatitis Start insulin sliding scale with CBGs every 4 hours.  Anion gap metabolic acidosis in the setting of uncontrolled diabetes with hyperglycemia Presented with serum bicarb 21 and anion gap of 19 Insulin coverage IV fluid Repeat chemistry panel  Essential hypertension BP is not at goal Add p.o. Lopressor 25 mg BID Start IV  Lopressor as needed with parameters. Continue Cardizem drip until seen by cardiology or if bradycardic or hypotensive  Elevated liver chemistries likely secondary to chronic alcohol use Elevated AST and T bilirubin Avoid hepatotoxic agents Repeat CMP in the morning  Leukocytosis likely reactive in the setting of acute pancreatitis Monitor  Alcohol abuse with concern for alcohol withdrawal Last alcohol intake was 2 to 3 days ago Started CIWA protocol Valium 10 mg 3 times daily x2 days then 5 mg twice daily x2 days then stop. Thiamine, folic acid, multivitamins  Tobacco use disorder Nicotine patch.   DVT prophylaxis: Heparin drip.  Code Status: Full code as stated by the patient himself.  Family Communication: None at bedside.  Disposition Plan: Admit to stepdown unit.  Consults called: Please consult cardiology in the morning.  Admission status: Inpatient status.  Patient will require at least 2 midnights for further evaluation and treatment of present condition.   Status is: Inpatient    Dispo: The patient is from: Home.               Anticipated d/c is to:10/28/2020.              Patient currently not stable for discharge due to ongoing management of acute pancreatitis and no atrial fibrillation with RVR.   Difficult to place patient: Not applicable.       Darlin Drop MD [facility] Pager [phone]  If 7PM-7AM, please contact night-coverage [email or web address] Password [facility]  10/26/2020, 9:08 PM

## 2020-10-26 NOTE — Plan of Care (Signed)
Care plan reviewed with patient.

## 2020-10-26 NOTE — ED Notes (Signed)
Pt denies CP/SOB despite HR being high. Pt appears in NAD

## 2020-10-27 ENCOUNTER — Inpatient Hospital Stay
Admit: 2020-10-27 | Discharge: 2020-10-27 | Disposition: A | Discharge status: Home / Self Care | Payer: BC Managed Care – PPO | Attending: Internal Medicine | Admitting: Internal Medicine

## 2020-10-27 ENCOUNTER — Encounter: Payer: Self-pay | Admitting: Internal Medicine

## 2020-10-27 DIAGNOSIS — E860 Dehydration: Secondary | ICD-10-CM

## 2020-10-27 DIAGNOSIS — I4891 Unspecified atrial fibrillation: Secondary | ICD-10-CM

## 2020-10-27 DIAGNOSIS — K852 Alcohol induced acute pancreatitis without necrosis or infection: Secondary | ICD-10-CM | POA: Diagnosis not present

## 2020-10-27 DIAGNOSIS — R739 Hyperglycemia, unspecified: Secondary | ICD-10-CM | POA: Diagnosis not present

## 2020-10-27 LAB — COMPREHENSIVE METABOLIC PANEL
ALT: 59 U/L — ABNORMAL HIGH (ref 0–44)
AST: 98 U/L — ABNORMAL HIGH (ref 15–41)
Albumin: 4.5 g/dL (ref 3.5–5.0)
Alkaline Phosphatase: 72 U/L (ref 38–126)
Anion gap: 12 (ref 5–15)
BUN: 13 mg/dL (ref 6–20)
CO2: 25 mmol/L (ref 22–32)
Calcium: 9.5 mg/dL (ref 8.9–10.3)
Chloride: 101 mmol/L (ref 98–111)
Creatinine, Ser: 0.85 mg/dL (ref 0.61–1.24)
GFR, Estimated: >60 mL/min (ref >60)
Glucose, Bld: 202 mg/dL — ABNORMAL HIGH (ref 70–99)
Potassium: 3.5 mmol/L (ref 3.5–5.1)
Sodium: 138 mmol/L (ref 135–145)
Total Bilirubin: 1.2 mg/dL (ref 0.3–1.2)
Total Protein: 7.4 g/dL (ref 6.5–8.1)

## 2020-10-27 LAB — GLUCOSE, CAPILLARY
Glucose-Capillary: 155 mg/dL — ABNORMAL HIGH (ref 70–99)
Glucose-Capillary: 200 mg/dL — ABNORMAL HIGH (ref 70–99)
Glucose-Capillary: 219 mg/dL — ABNORMAL HIGH (ref 70–99)
Glucose-Capillary: 220 mg/dL — ABNORMAL HIGH (ref 70–99)
Glucose-Capillary: 222 mg/dL — ABNORMAL HIGH (ref 70–99)
Glucose-Capillary: 267 mg/dL — ABNORMAL HIGH (ref 70–99)

## 2020-10-27 LAB — CBC WITH DIFFERENTIAL/PLATELET
Abs Immature Granulocytes: 0.06 10*3/uL (ref 0.00–0.07)
Basophils Absolute: 0 10*3/uL (ref 0.0–0.1)
Basophils Relative: 0 %
Eosinophils Absolute: 0 10*3/uL (ref 0.0–0.5)
Eosinophils Relative: 0 %
HCT: 44.1 % (ref 39.0–52.0)
Hemoglobin: 15.4 g/dL (ref 13.0–17.0)
Immature Granulocytes: 0 %
Lymphocytes Relative: 5 %
Lymphs Abs: 0.8 10*3/uL (ref 0.7–4.0)
MCH: 30.7 pg (ref 26.0–34.0)
MCHC: 34.9 g/dL (ref 30.0–36.0)
MCV: 87.8 fL (ref 80.0–100.0)
Monocytes Absolute: 1.2 10*3/uL — ABNORMAL HIGH (ref 0.1–1.0)
Monocytes Relative: 7 %
Neutro Abs: 13.8 10*3/uL — ABNORMAL HIGH (ref 1.7–7.7)
Neutrophils Relative %: 88 %
Platelets: 147 10*3/uL — ABNORMAL LOW (ref 150–400)
RBC: 5.02 MIL/uL (ref 4.22–5.81)
RDW: 13.3 % (ref 11.5–15.5)
WBC: 15.9 10*3/uL — ABNORMAL HIGH (ref 4.0–10.5)
nRBC: 0 % (ref 0.0–0.2)

## 2020-10-27 LAB — ECHOCARDIOGRAM COMPLETE
AR max vel: 3.35 cm2
AV Area VTI: 2.96 cm2
AV Area mean vel: 3.02 cm2
AV Mean grad: 4 mmHg
AV Peak grad: 7.2 mmHg
Ao pk vel: 1.34 m/s
Area-P 1/2: 5.64 cm2
Calc EF: 46.6 %
Height: 71 in
MV VTI: 3.87 cm2
S' Lateral: 4.4 cm
Single Plane A2C EF: 55.1 %
Single Plane A4C EF: 40.2 %
Weight: 3280 oz

## 2020-10-27 LAB — HEMOGLOBIN A1C
Hgb A1c MFr Bld: 10.5 % — ABNORMAL HIGH (ref 4.8–5.6)
Mean Plasma Glucose: 254.65 mg/dL

## 2020-10-27 LAB — PHOSPHORUS: Phosphorus: 2.6 mg/dL (ref 2.5–4.6)

## 2020-10-27 LAB — MAGNESIUM: Magnesium: 2.2 mg/dL (ref 1.7–2.4)

## 2020-10-27 MED ORDER — HYDROMORPHONE HCL 1 MG/ML IJ SOLN
1.0000 mg | INTRAMUSCULAR | Status: DC | PRN
  Administered 2020-10-27 – 2020-10-28 (×6): 1 mg via INTRAVENOUS
  Filled 2020-10-27 (×6): qty 1

## 2020-10-27 MED ORDER — PERFLUTREN LIPID MICROSPHERE
1.0000 mL | INTRAVENOUS | Status: AC | PRN
  Administered 2020-10-27: 2 mL via INTRAVENOUS
  Filled 2020-10-27: qty 10

## 2020-10-27 MED ORDER — DILTIAZEM HCL 30 MG PO TABS
30.0000 mg | ORAL_TABLET | Freq: Four times a day (QID) | ORAL | Status: DC
  Administered 2020-10-27 – 2020-10-28 (×2): 30 mg via ORAL
  Filled 2020-10-27 (×2): qty 1

## 2020-10-27 MED ORDER — INSULIN GLARGINE 100 UNIT/ML ~~LOC~~ SOLN
10.0000 [IU] | Freq: Two times a day (BID) | SUBCUTANEOUS | Status: DC
  Administered 2020-10-27 – 2020-10-28 (×2): 10 [IU] via SUBCUTANEOUS
  Filled 2020-10-27 (×3): qty 0.1

## 2020-10-27 MED ORDER — HYDRALAZINE HCL 50 MG PO TABS
25.0000 mg | ORAL_TABLET | Freq: Three times a day (TID) | ORAL | Status: DC
  Administered 2020-10-27 – 2020-10-28 (×3): 25 mg via ORAL
  Filled 2020-10-27 (×3): qty 1

## 2020-10-27 MED ORDER — CHLORHEXIDINE GLUCONATE CLOTH 2 % EX PADS
6.0000 | MEDICATED_PAD | Freq: Every day | CUTANEOUS | Status: DC
  Administered 2020-10-27 – 2020-10-28 (×2): 6 via TOPICAL

## 2020-10-27 NOTE — Progress Notes (Signed)
Inpatient Diabetes Program Recommendations  AACE/ADA: New Consensus Statement on Inpatient Glycemic Control (2015)  Target Ranges:  Prepandial:   less than 140 mg/dL      Peak postprandial:   less than 180 mg/dL (1-2 hours)      Critically ill patients:  140 - 180 mg/dL   Lab Results  Component Value Date   GLUCAP 267 (H) 10/27/2020   HGBA1C 10.5 (H) 10/26/2020    Review of Glycemic Control Results for Micheal Wong, Micheal Wong (MRN [id number]) as of 10/27/2020 11:40  Ref. Range 10/26/2020 21:08 10/26/2020 23:39 10/27/2020 04:01 10/27/2020 07:34 10/27/2020 11:36  Glucose-Capillary Latest Ref Range: 70 - 99 mg/dL 272 (H) 536 (H) 644 (H) 219 (H) 267 (H)   Diabetes history: DM 2 Outpatient Diabetes medications:  Lantus 30 units daily, Metformin 500 mg daily, Humalog 25-30 units tid with meals Current orders for Inpatient glycemic control:  Novolog moderate q 4 hours  Inpatient Diabetes Program Recommendations:    Note patient is NPO.  It does appear that he needs basal insulin since blood sugars>200 mg/dL. Please consider adding Levemir 10 units bid.   Thanks,  Beryl Meager, RN, BC-ADM Inpatient Diabetes Coordinator Pager [phone] (8a-5p)

## 2020-10-27 NOTE — Progress Notes (Signed)
*  PRELIMINARY RESULTS* Echocardiogram 2D Echocardiogram has been performed.  Micheal Wong 10/27/2020, 11:09 AM

## 2020-10-27 NOTE — Progress Notes (Signed)
PROGRESS NOTE    Deston Bilyeu  GDJ:[id number] DOB: 05-Jun-1971 DOA: 10/26/2020 PCP: Patient, No Pcp Per   Brief Narrative: Taken from H&P. Jackston [place] is a 50 y.o. male with medical history significant for alcohol abuse, prior history of alcoholic pancreatitis for which she was hospitalized in July 2021, tobacco use disorder, type 2 diabetes, essential hypertension, hyperlipidemia who presented to [facility] ED due to worsening nausea, vomiting, and abdominal pain of 1 day duration. Found to have mild leukocytosis, hyperglycemia, anion gap metabolic acidosis and elevated lipase at 363.  Patient continued to drink and his last alcohol use was day prior to starting symptoms.  CT abdomen consistent with acute pancreatitis.  Admitted for acute alcoholic pancreatitis. Also found to have new onset A. fib with RVR-currently on Cardizem infusion.  Subjective: Patient continued to have epigastric pain stating that Dilaudid is taking only a edge away.  Mild nausea but no vomiting today.  He was not taking his medications including insulin very regularly.  Assessment & Plan:   Active Problems:   Acute alcoholic pancreatitis  Acute alcoholic pancreatitis.  Prior admission with similar complaint in July 2021.  Patient is continue to drink.  CT was negative for ductal dilatation, calcification or necrosis. Pain seems poorly controlled. -Increase the dose of Dilaudid from 0.5 mg to 1 mg every 4 hourly. -Keep him n.p.o.-can start clear liquid once pain improves. -Continue with LR -Counseled again against alcohol cessation.  New onset A. fib with RVR.  Converted back to sinus overnight. Cardiology was consulted-will appreciate their recommendations. Heart rate in low 100s-can be because of pain. Echocardiogram within normal limits. -I will defer long-term anticoagulation decision to cardiology, currently on Lovenox. -Continue with metoprolol -Wean off from Cardizem infusion.  Anion gap metabolic acidosis.   Resolved.  There was some concern of DKA but patient was not placed on Endo tool and improved with insulin and IV fluid.  Type 2 diabetes mellitus with hyperglycemia.  Poorly controlled diabetes with hyperglycemia and A1c of 10.5.  Most likely secondary to being noncompliant. CBG with some improvement but remained elevated. -Add Lantus 10 units twice daily-patient was supposed to take 30 units at bedtime at home. -Continue with sliding scale.  Hypertension.  Blood pressure remained elevated.  Patient was on amlodipine at home. Currently on metoprolol and Cardizem.  Ideally should be on ACE inhibitor or ARB but ACE inhibitor allergy is listed as swelling. -Can restart home dose of amlodipine once off from Cardizem infusion.  Elevated liver enzymes.  Most likely with alcohol use and alcoholic pancreatitis, started improving. -Continue to monitor  History of alcohol abuse.  Last use was 2 to 3 days ago so patient is high risk for withdrawal. -Continue with CIWA protocol. -Continue with thiamine, folic acid and multivitamin.  Tobacco use disorder. -Continue with nicotine patch  Objective: Vitals:   10/27/20 0900 10/27/20 1000 10/27/20 1100 10/27/20 1200  BP: (!) 156/86 (!) 188/106 (!) 174/108 (!) 163/95  Pulse: 100 (!) 101 96   Resp: 18 (!) 21 19 16   Temp:      TempSrc:      SpO2: 97% 99% 97%   Weight:      Height:        Intake/Output Summary (Last 24 hours) at 10/27/2020 1305 Last data filed at 10/27/2020 1100 Gross per 24 hour  Intake 1544.23 ml  Output 1000 ml  Net 544.23 ml   Filed Weights   10/26/20 1538  Weight: 93 kg    Examination:  General exam: Appears calm and comfortable  Respiratory system: Clear to auscultation. Respiratory effort normal. Cardiovascular system: S1 & S2 heard, RRR.  Gastrointestinal system: Soft, epigastric tenderness, nondistended, bowel sounds positive. Central nervous system: Alert and oriented. No focal neurological  deficits. Extremities: No edema, no cyanosis, pulses intact and symmetrical. Psychiatry: Judgement and insight appear normal. Mood & affect appropriate.    DVT prophylaxis: Lovenox Code Status: Full Family Communication: Discussed with patient  Disposition Plan:  Status is: Inpatient  Remains inpatient appropriate because:Inpatient level of care appropriate due to severity of illness   Dispo:  Patient From: Home  Planned Disposition: Home  Medically stable for discharge: No   Level of care: Stepdown  All the records are reviewed and case discussed with Care Management/Social Worker. Management plans discussed with the patient, nursing and they are in agreement.  Consultants:   Cardiology  Procedures:  Antimicrobials:   Data Reviewed: I have personally reviewed following labs and imaging studies  CBC: Recent Labs  Lab 10/26/20 1540 10/26/20 2109 10/27/20 0359  WBC 15.7* 18.1* 15.9*  NEUTROABS  --   --  13.8*  HGB 15.2 15.2 15.4  HCT 45.4 44.3 44.1  MCV 89.4 88.8 87.8  PLT 190 184 147*   Basic Metabolic Panel: Recent Labs  Lab 10/26/20 1540 10/26/20 2109 10/27/20 0359  NA 136  --  138  K 4.0  --  3.5  CL 96*  --  101  CO2 21*  --  25  GLUCOSE 432*  --  202*  BUN 15  --  13  CREATININE 1.01  --  0.85  CALCIUM 9.6  --  9.5  MG  --  1.6* 2.2  PHOS  --  3.9 2.6   GFR: Estimated Creatinine Clearance: 122.5 mL/min (by C-G formula based on SCr of 0.85 mg/dL). Liver Function Tests: Recent Labs  Lab 10/26/20 1540 10/27/20 0359  AST 52* 98*  ALT 44 59*  ALKPHOS 80 72  BILITOT 1.3* 1.2  PROT 7.9 7.4  ALBUMIN 5.0 4.5   Recent Labs  Lab 10/26/20 1540  LIPASE 363*   No results for input(s): AMMONIA in the last 168 hours. Coagulation Profile: No results for input(s): INR, PROTIME in the last 168 hours. Cardiac Enzymes: No results for input(s): CKTOTAL, CKMB, CKMBINDEX, TROPONINI in the last 168 hours. BNP (last 3 results) No results for input(s):  PROBNP in the last 8760 hours. HbA1C: Recent Labs    10/26/20 2109  HGBA1C 10.5*   CBG: Recent Labs  Lab 10/26/20 2108 10/26/20 2339 10/27/20 0401 10/27/20 0734 10/27/20 1136  GLUCAP 301* 292* 220* 219* 267*   Lipid Profile: No results for input(s): CHOL, HDL, LDLCALC, TRIG, CHOLHDL, LDLDIRECT in the last 72 hours. Thyroid Function Tests: No results for input(s): TSH, T4TOTAL, FREET4, T3FREE, THYROIDAB in the last 72 hours. Anemia Panel: No results for input(s): VITAMINB12, FOLATE, FERRITIN, TIBC, IRON, RETICCTPCT in the last 72 hours. Sepsis Labs: No results for input(s): PROCALCITON, LATICACIDVEN in the last 168 hours.  Recent Results (from the past 240 hour(s))  Resp Panel by RT-PCR (Flu A&B, Covid) Nasopharyngeal Swab     Status: None   Collection Time: 10/26/20  9:22 PM   Specimen: Nasopharyngeal Swab; Nasopharyngeal(NP) swabs in vial transport medium  Result Value Ref Range Status   SARS Coronavirus 2 by RT PCR NEGATIVE NEGATIVE Final    Comment: (NOTE) SARS-CoV-2 target nucleic acids are NOT DETECTED.  The SARS-CoV-2 RNA is generally detectable in upper respiratory specimens  during the acute phase of infection. The lowest concentration of SARS-CoV-2 viral copies this assay can detect is 138 copies/mL. A negative result does not preclude SARS-Cov-2 infection and should not be used as the sole basis for treatment or other patient management decisions. A negative result may occur with  improper specimen collection/handling, submission of specimen other than nasopharyngeal swab, presence of viral mutation(s) within the areas targeted by this assay, and inadequate number of viral copies(<138 copies/mL). A negative result must be combined with clinical observations, patient history, and epidemiological information. The expected result is Negative.  Fact Sheet for Patients:  BloggerCourse.com  Fact Sheet for Healthcare Providers:   SeriousBroker.it  This test is no t yet approved or cleared by the [place] FDA and  has been authorized for detection and/or diagnosis of SARS-CoV-2 by FDA under an Emergency Use Authorization (EUA). This EUA will remain  in effect (meaning this test can be used) for the duration of the COVID-19 declaration under Section 564(b)(1) of the Act, 21 U.S.C.section 360bbb-3(b)(1), unless the authorization is terminated  or revoked sooner.       Influenza A by PCR NEGATIVE NEGATIVE Final   Influenza B by PCR NEGATIVE NEGATIVE Final    Comment: (NOTE) The Xpert Xpress SARS-CoV-2/FLU/RSV plus assay is intended as an aid in the diagnosis of influenza from Nasopharyngeal swab specimens and should not be used as a sole basis for treatment. Nasal washings and aspirates are unacceptable for Xpert Xpress SARS-CoV-2/FLU/RSV testing.  Fact Sheet for Patients: BloggerCourse.com  Fact Sheet for Healthcare Providers: SeriousBroker.it  This test is not yet approved or cleared by the [place] FDA and has been authorized for detection and/or diagnosis of SARS-CoV-2 by FDA under an Emergency Use Authorization (EUA). This EUA will remain in effect (meaning this test can be used) for the duration of the COVID-19 declaration under Section 564(b)(1) of the Act, 21 U.S.C. section 360bbb-3(b)(1), unless the authorization is terminated or revoked.  Performed at [facility], [address] [place], [place] [address]      Radiology Studies: CT ABDOMEN PELVIS W CONTRAST  Result Date: 10/26/2020 CLINICAL DATA:  Abdominal pain, nausea, vomiting EXAM: CT ABDOMEN AND PELVIS WITH CONTRAST TECHNIQUE: Multidetector CT imaging of the abdomen and pelvis was performed using the standard protocol following bolus administration of intravenous contrast. CONTRAST:  OMNIPAQUE IOHEXOL 300 MG/ML  SOLN COMPARISON:   02/27/2020 FINDINGS: Lower chest: No acute abnormality Hepatobiliary: Diffuse low-density throughout the liver compatible with fatty infiltration. No focal abnormality. Gallbladder unremarkable. Pancreas: Inflammation/stranding noted around the pancreas, most notable in the pancreatic head and uncinate process region compatible with acute pancreatitis. No ductal dilatation. Spleen: No focal abnormality.  Normal size. Adrenals/Urinary Tract: No adrenal abnormality. No focal renal abnormality. No stones or hydronephrosis. Urinary bladder is unremarkable. Stomach/Bowel: Normal appendix. Stomach, large and small bowel grossly unremarkable. Vascular/Lymphatic: Heavily calcified aorta and iliac vessels. No evidence of aneurysm or adenopathy. Reproductive: No visible focal abnormality. Other: No free fluid or free air. Musculoskeletal: No acute bony abnormality. IMPRESSION: Inflammatory changes around the pancreatic head and uncinate process compatible with acute pancreatitis. Hepatic steatosis. Aortoiliac atherosclerosis. Electronically Signed   By: Charlett Nose M.D.   On: 10/26/2020 18:47   ECHOCARDIOGRAM COMPLETE  Result Date: 10/27/2020    ECHOCARDIOGRAM REPORT   Patient Name:   MARQUEE FUCHS Date of Exam: 10/27/2020 Medical Rec #:  [id number]    Height:       71.0 in Accession #:  [id number]   Weight:       205.0 lb Date of Birth:  1970/12/29    BSA:          2.131 m Patient Age:    49 years     BP:           179/97 mmHg Patient Gender: M            HR:           103 bpm. Exam Location:  [facility] Procedure: 2D Echo, Color Doppler, Cardiac Doppler and Intracardiac            Opacification Agent Indications:     I48.91 Atrial fibrillation  History:         Patient has no prior history of Echocardiogram examinations.                  Risk Factors:Hypertension, Diabetes, Dyslipidemia and Current                  Smoker.  Sonographer:     Humphrey RollsJoan Heiss RDCS (AE) Referring Phys:  [id number] Oliver PilaAROLE N HALL Diagnosing Phys: Harold HedgeKenneth  Fath MD  Sonographer Comments: Suboptimal apical window and no subcostal window. Image acquisition challenging due to patient body habitus. IMPRESSIONS  1. Left ventricular ejection fraction, by estimation, is 55 to 60%. The left ventricle has normal function. The left ventricle has no regional wall motion abnormalities. Left ventricular diastolic parameters were normal.  2. Right ventricular systolic function is normal. The right ventricular size is normal.  3. The mitral valve was not well visualized. Trivial mitral valve regurgitation.  4. The aortic valve was not well visualized. Aortic valve regurgitation is trivial. FINDINGS  Left Ventricle: Left ventricular ejection fraction, by estimation, is 55 to 60%. The left ventricle has normal function. The left ventricle has no regional wall motion abnormalities. The left ventricular internal cavity size was normal in size. There is  no left ventricular hypertrophy. Left ventricular diastolic parameters were normal. Right Ventricle: The right ventricular size is normal. No increase in right ventricular wall thickness. Right ventricular systolic function is normal. Left Atrium: Left atrial size was normal in size. Right Atrium: Right atrial size was normal in size. Pericardium: There is no evidence of pericardial effusion. Mitral Valve: The mitral valve was not well visualized. Trivial mitral valve regurgitation. MV peak gradient, 4.8 mmHg. The mean mitral valve gradient is 3.0 mmHg. Tricuspid Valve: The tricuspid valve is not well visualized. Tricuspid valve regurgitation is trivial. Aortic Valve: The aortic valve was not well visualized. Aortic valve regurgitation is trivial. Aortic valve mean gradient measures 4.0 mmHg. Aortic valve peak gradient measures 7.2 mmHg. Aortic valve area, by VTI measures 2.96 cm. Pulmonic Valve: The pulmonic valve was not well visualized. Pulmonic valve regurgitation is trivial. Aorta: The aortic root is normal in size and structure.  IAS/Shunts: The interatrial septum was not assessed.  LEFT VENTRICLE PLAX 2D LVIDd:         5.70 cm      Diastology LVIDs:         4.40 cm      LV e' medial:    11.30 cm/s LV PW:         0.80 cm      LV E/e' medial:  9.0 LV IVS:        1.00 cm      LV e' lateral:   8.38 cm/s LVOT diam:     2.40 cm  LV E/e' lateral: 12.2 LV SV:         68 LV SV Index:   32 LVOT Area:     4.52 cm  LV Volumes (MOD) LV vol d, MOD A2C: 88.7 ml LV vol d, MOD A4C: 104.0 ml LV vol s, MOD A2C: 39.8 ml LV vol s, MOD A4C: 62.2 ml LV SV MOD A2C:     48.9 ml LV SV MOD A4C:     104.0 ml LV SV MOD BP:      46.9 ml LEFT ATRIUM         Index LA diam:    3.50 cm 1.64 cm/m  AORTIC VALVE                   PULMONIC VALVE AV Area (Vmax):    3.35 cm    PV Vmax:       0.73 m/s AV Area (Vmean):   3.02 cm    PV Vmean:      54.200 cm/s AV Area (VTI):     2.96 cm    PV VTI:        0.137 m AV Vmax:           134.00 cm/s PV Peak grad:  2.1 mmHg AV Vmean:          96.500 cm/s PV Mean grad:  1.0 mmHg AV VTI:            0.229 m AV Peak Grad:      7.2 mmHg AV Mean Grad:      4.0 mmHg LVOT Vmax:         99.20 cm/s LVOT Vmean:        64.500 cm/s LVOT VTI:          0.150 m LVOT/AV VTI ratio: 0.66  AORTA Ao Root diam: 3.00 cm MITRAL VALVE MV Area (PHT): 5.64 cm     SHUNTS MV Area VTI:   3.87 cm     Systemic VTI:  0.15 m MV Peak grad:  4.8 mmHg     Systemic Diam: 2.40 cm MV Mean grad:  3.0 mmHg MV Vmax:       1.10 m/s MV Vmean:      80.9 cm/s MV Decel Time: 135 msec MV E velocity: 102.00 cm/s Harold Hedge MD Electronically signed by Harold Hedge MD Signature Date/Time: 10/27/2020/12:03:15 PM    Final     Scheduled Meds: . diazepam  10 mg Oral TID  . enoxaparin (LOVENOX) injection  1 mg/kg Subcutaneous Q12H  . folic acid  1 mg Oral Daily  . insulin aspart  0-15 Units Subcutaneous Q4H  . insulin glargine  10 Units Subcutaneous BID  . metoprolol tartrate  25 mg Oral BID  . multivitamin with minerals  1 tablet Oral Daily  . nicotine  14 mg Transdermal  Daily  . thiamine  100 mg Oral Daily   Or  . thiamine  100 mg Intravenous Daily   Continuous Infusions: . diltiazem (CARDIZEM) infusion 5 mg/hr (10/27/20 1059)  . lactated ringers 150 mL/hr at 10/27/20 1100     LOS: 1 day   Time spent: 40 minutes More than 50% of the time was spent in counseling/coordination of care  Arnetha Courser, MD [facility]  If 7PM-7AM, please contact night-coverage [email or web address]  10/27/2020, 1:05 PM   This record has been created using Conservation officer, historic buildings. Errors have been sought and corrected,but may not always be located. Such creation errors do  not reflect on the standard of care.

## 2020-10-27 NOTE — Consult Note (Signed)
Cardiology Consultation Note    Patient ID: Micheal Wong, MRN: [id number], DOB/AGE: [birth year] 50 y.o. Admit date: 10/26/2020   Date of Consult: 10/27/2020 Primary Physician: Patient, No Pcp Per Primary Cardiologist: none  Chief Complaint: Atrial fibrillation Reason for Consultation: Atrial fibrillation Requesting MD: Dr. Nelson Chimes  HPI: Micheal Wong is a 50 y.o. male with history of alcohol abuse with prior history of alcoholic pancreatitis who was at admitted to [facility] with a diagnosis of alcoholic pancreatitis.  Complained of severe epigastric pain.  He presented to the emergency room where he was noted to have atrial fibrillation with rapid tracheal response.  Was loaded with IV Cardizem started on Cardizem drip.  He was started on a heparin drip.  He is currently converted back to sinus tachycardia with IV Cardizem.  Remains on heparin.  Is hemodynamically stable.  Pain is improved.  Past Medical History:  Diagnosis Date  . Diabetes mellitus without complication (HCC)   . Hypertension   . Pancreatitis       Surgical History: History reviewed. No pertinent surgical history.   Home Meds: Prior to Admission medications   Medication Sig Start Date End Date Taking? Authorizing Provider  amLODipine (NORVASC) 10 MG tablet Take 10 mg by mouth daily.    [provider]  atorvastatin (LIPITOR) 10 MG tablet Take 1 tablet (10 mg total) by mouth daily. Does not know dose 02/29/20   Alford Highland, MD  fenofibrate (TRICOR) 48 MG tablet Take 48 mg by mouth daily. Does not know dose    [provider]  insulin glargine (LANTUS) 100 UNIT/ML injection Inject 30 Units into the skin daily.    [provider]  metFORMIN (GLUCOPHAGE) 500 MG tablet Take 500 mg by mouth daily with breakfast. Not sure of the dose    [provider]  oxyCODONE (OXY IR/ROXICODONE) 5 MG immediate release tablet Take 1 tablet (5 mg total) by mouth every 6 (six) hours as needed for severe pain.  02/29/20   Alford Highland, MD  thiamine 100 MG tablet Take 1 tablet (100 mg total) by mouth daily. 03/01/20   Alford Highland, MD    Inpatient Medications:  . Chlorhexidine Gluconate Cloth  6 each Topical Daily  . diazepam  10 mg Oral TID  . enoxaparin (LOVENOX) injection  1 mg/kg Subcutaneous Q12H  . folic acid  1 mg Oral Daily  . hydrALAZINE  25 mg Oral Q8H  . insulin aspart  0-15 Units Subcutaneous Q4H  . insulin glargine  10 Units Subcutaneous BID  . metoprolol tartrate  25 mg Oral BID  . multivitamin with minerals  1 tablet Oral Daily  . nicotine  14 mg Transdermal Daily  . thiamine  100 mg Oral Daily   Or  . thiamine  100 mg Intravenous Daily   . diltiazem (CARDIZEM) infusion Stopped (10/27/20 1630)  . lactated ringers 150 mL/hr at 10/27/20 1900    Allergies:  Allergies  Allergen Reactions  . Drug Class [Ace Inhibitors] Swelling    Swelling    Social History   Socioeconomic History  . Marital status: Unknown    Spouse name: Not on file  . Number of children: Not on file  . Years of education: Not on file  . Highest education level: Not on file  Occupational History  . Not on file  Tobacco Use  . Smoking status: Current Every Day Smoker    Packs/day: 0.50    Types: Cigarettes  . Smokeless tobacco: Never Used  Substance and Sexual Activity  . Alcohol use: Yes    Comment: occasionally  . Drug use: Not Currently  . Sexual activity: Not on file  Other Topics Concern  . Not on file  Social History Narrative  . Not on file   Social Determinants of Health   Financial Resource Strain: Not on file  Food Insecurity: Not on file  Transportation Needs: Not on file  Physical Activity: Not on file  Stress: Not on file  Social Connections: Not on file  Intimate Partner Violence: Not on file     History reviewed. No pertinent family history.   Review of Systems: A 12-system review of systems was performed and is negative except as noted in the  HPI.  Labs: No results for input(s): CKTOTAL, CKMB, TROPONINI in the last 72 hours. Lab Results  Component Value Date   WBC 15.9 (H) 10/27/2020   HGB 15.4 10/27/2020   HCT 44.1 10/27/2020   MCV 87.8 10/27/2020   PLT 147 (L) 10/27/2020    Recent Labs  Lab 10/27/20 0359  NA 138  K 3.5  CL 101  CO2 25  BUN 13  CREATININE 0.85  CALCIUM 9.5  PROT 7.4  BILITOT 1.2  ALKPHOS 72  ALT 59*  AST 98*  GLUCOSE 202*   Lab Results  Component Value Date   CHOL 222 (H) 02/27/2020   HDL 53 02/27/2020   LDLCALC 128 (H) 02/27/2020   TRIG 205 (H) 02/27/2020   No results found for: DDIMER  Radiology/Studies:  CT ABDOMEN PELVIS W CONTRAST  Result Date: 10/26/2020 CLINICAL DATA:  Abdominal pain, nausea, vomiting EXAM: CT ABDOMEN AND PELVIS WITH CONTRAST TECHNIQUE: Multidetector CT imaging of the abdomen and pelvis was performed using the standard protocol following bolus administration of intravenous contrast. CONTRAST:  OMNIPAQUE IOHEXOL 300 MG/ML  SOLN COMPARISON:  02/27/2020 FINDINGS: Lower chest: No acute abnormality Hepatobiliary: Diffuse low-density throughout the liver compatible with fatty infiltration. No focal abnormality. Gallbladder unremarkable. Pancreas: Inflammation/stranding noted around the pancreas, most notable in the pancreatic head and uncinate process region compatible with acute pancreatitis. No ductal dilatation. Spleen: No focal abnormality.  Normal size. Adrenals/Urinary Tract: No adrenal abnormality. No focal renal abnormality. No stones or hydronephrosis. Urinary bladder is unremarkable. Stomach/Bowel: Normal appendix. Stomach, large and small bowel grossly unremarkable. Vascular/Lymphatic: Heavily calcified aorta and iliac vessels. No evidence of aneurysm or adenopathy. Reproductive: No visible focal abnormality. Other: No free fluid or free air. Musculoskeletal: No acute bony abnormality. IMPRESSION: Inflammatory changes around the pancreatic head and uncinate  process compatible with acute pancreatitis. Hepatic steatosis. Aortoiliac atherosclerosis. Electronically Signed   By: Charlett Nose M.D.   On: 10/26/2020 18:47   ECHOCARDIOGRAM COMPLETE  Result Date: 10/27/2020    ECHOCARDIOGRAM REPORT   Patient Name:   Micheal Wong Date of Exam: 10/27/2020 Medical Rec #:  [id number]    Height:       71.0 in Accession #:    [id number]   Weight:       205.0 lb Date of Birth:  October 14, 1970    BSA:          2.131 m Patient Age:    49 years     BP:           179/97 mmHg Patient Gender: M            HR:           103 bpm. Exam Location:  [facility] Procedure: 2D Echo, Color Doppler, Cardiac  Doppler and Intracardiac            Opacification Agent Indications:     I48.91 Atrial fibrillation  History:         Patient has no prior history of Echocardiogram examinations.                  Risk Factors:Hypertension, Diabetes, Dyslipidemia and Current                  Smoker.  Sonographer:     Humphrey Rolls RDCS (AE) Referring Phys:  [id number] Oliver Pila HALL Diagnosing Phys: Harold Hedge MD  Sonographer Comments: Suboptimal apical window and no subcostal window. Image acquisition challenging due to patient body habitus. IMPRESSIONS  1. Left ventricular ejection fraction, by estimation, is 55 to 60%. The left ventricle has normal function. The left ventricle has no regional wall motion abnormalities. Left ventricular diastolic parameters were normal.  2. Right ventricular systolic function is normal. The right ventricular size is normal.  3. The mitral valve was not well visualized. Trivial mitral valve regurgitation.  4. The aortic valve was not well visualized. Aortic valve regurgitation is trivial. FINDINGS  Left Ventricle: Left ventricular ejection fraction, by estimation, is 55 to 60%. The left ventricle has normal function. The left ventricle has no regional wall motion abnormalities. The left ventricular internal cavity size was normal in size. There is  no left ventricular hypertrophy. Left  ventricular diastolic parameters were normal. Right Ventricle: The right ventricular size is normal. No increase in right ventricular wall thickness. Right ventricular systolic function is normal. Left Atrium: Left atrial size was normal in size. Right Atrium: Right atrial size was normal in size. Pericardium: There is no evidence of pericardial effusion. Mitral Valve: The mitral valve was not well visualized. Trivial mitral valve regurgitation. MV peak gradient, 4.8 mmHg. The mean mitral valve gradient is 3.0 mmHg. Tricuspid Valve: The tricuspid valve is not well visualized. Tricuspid valve regurgitation is trivial. Aortic Valve: The aortic valve was not well visualized. Aortic valve regurgitation is trivial. Aortic valve mean gradient measures 4.0 mmHg. Aortic valve peak gradient measures 7.2 mmHg. Aortic valve area, by VTI measures 2.96 cm. Pulmonic Valve: The pulmonic valve was not well visualized. Pulmonic valve regurgitation is trivial. Aorta: The aortic root is normal in size and structure. IAS/Shunts: The interatrial septum was not assessed.  LEFT VENTRICLE PLAX 2D LVIDd:         5.70 cm      Diastology LVIDs:         4.40 cm      LV e' medial:    11.30 cm/s LV PW:         0.80 cm      LV E/e' medial:  9.0 LV IVS:        1.00 cm      LV e' lateral:   8.38 cm/s LVOT diam:     2.40 cm      LV E/e' lateral: 12.2 LV SV:         68 LV SV Index:   32 LVOT Area:     4.52 cm  LV Volumes (MOD) LV vol d, MOD A2C: 88.7 ml LV vol d, MOD A4C: 104.0 ml LV vol s, MOD A2C: 39.8 ml LV vol s, MOD A4C: 62.2 ml LV SV MOD A2C:     48.9 ml LV SV MOD A4C:     104.0 ml LV SV MOD BP:      46.9  ml LEFT ATRIUM         Index LA diam:    3.50 cm 1.64 cm/m  AORTIC VALVE                   PULMONIC VALVE AV Area (Vmax):    3.35 cm    PV Vmax:       0.73 m/s AV Area (Vmean):   3.02 cm    PV Vmean:      54.200 cm/s AV Area (VTI):     2.96 cm    PV VTI:        0.137 m AV Vmax:           134.00 cm/s PV Peak grad:  2.1 mmHg AV Vmean:           96.500 cm/s PV Mean grad:  1.0 mmHg AV VTI:            0.229 m AV Peak Grad:      7.2 mmHg AV Mean Grad:      4.0 mmHg LVOT Vmax:         99.20 cm/s LVOT Vmean:        64.500 cm/s LVOT VTI:          0.150 m LVOT/AV VTI ratio: 0.66  AORTA Ao Root diam: 3.00 cm MITRAL VALVE MV Area (PHT): 5.64 cm     SHUNTS MV Area VTI:   3.87 cm     Systemic VTI:  0.15 m MV Peak grad:  4.8 mmHg     Systemic Diam: 2.40 cm MV Mean grad:  3.0 mmHg MV Vmax:   Harold Hedgem/s MV Vmean:      80.9 cm/s MV Decel Time: 135 msec MV E velocity: 102.00 cm/s Lileigh Fahringer MD Electronically signed by Harold Hedge MD Signature Date/Time: 10/27/2020/12:03:15 PM    Final     Wt Readings from Last 3 Encounters:  10/26/20 93 kg  02/27/20 90.7 kg    EKG: Atrial fibrillation rapid ventricular response  Physical Exam:  Blood pressure (!) 151/97, pulse (!) 106, temperature 98.2 F (36.8 C), temperature source Oral, resp. rate 15, height  (1.803 m), weight 93 kg, SpO2 96 %. Body mass index is 28.59 kg/m. General: Well developed, well nourished, in no acute distress. Head: Normocephalic, atraumatic, sclera non-icteric, no xanthomas, nares are without discharge.  Neck: Negative for carotid bruits. JVD not elevated. Lungs: Clear bilaterally to auscultation without wheezes, rales, or rhonchi. Breathing is unlabored. Heart: RRR with S1 S2. No murmurs, rubs, or gallops appreciated. Abdomen: Soft, non-tender, non-distended with normoactive bowel sounds. No hepatomegaly. No rebound/guarding. No obvious abdominal masses. Msk:  Strength and tone appear normal for age. Extremities: No clubbing or cyanosis. No edema.  Distal pedal pulses are 2+ and equal bilaterally. Neuro: Alert and oriented X 3. No facial asymmetry. No focal deficit. Moves all extremities spontaneously. Psych:  Responds to questions appropriately with a normal affect.     Assessment and Plan  50 year old male with history of alcohol abuse and alcoholic pancreatitis  admitted with abdominal pain consistent with alcoholic pancreatitis.  Was noted to be in atrial fibrillation rapid ventricular response.  Has converted to sinus rhythm with Cardizem.  Has been on heparin.  Will need to continue with Cardizem.  Will convert to p.o.  Concern over possible alcohol abuse continuing in the face of alcohol abuse he is concerning.  I have discussed this briefly with him.  We will do  this prior to discharge at this point will defer chronic anticoagulation until I can confirm the patient will be compliant with his medications and limit alcohol intake.  A. fib is likely secondary to alcohol abuse and pancreatitis.  Has not had this in the past and this may be a transient event.  At this point will defer anticoagulation.  Signed, Dalia HeadingKenneth A Kadien Lineman MD 10/27/2020, 9:50 PM Pager: [phone]

## 2020-10-28 DIAGNOSIS — R739 Hyperglycemia, unspecified: Secondary | ICD-10-CM | POA: Diagnosis not present

## 2020-10-28 DIAGNOSIS — K852 Alcohol induced acute pancreatitis without necrosis or infection: Secondary | ICD-10-CM | POA: Diagnosis not present

## 2020-10-28 DIAGNOSIS — E860 Dehydration: Secondary | ICD-10-CM | POA: Diagnosis not present

## 2020-10-28 DIAGNOSIS — I4891 Unspecified atrial fibrillation: Secondary | ICD-10-CM | POA: Diagnosis not present

## 2020-10-28 LAB — COMPREHENSIVE METABOLIC PANEL
ALT: 32 U/L (ref 0–44)
AST: 26 U/L (ref 15–41)
Albumin: 3.7 g/dL (ref 3.5–5.0)
Alkaline Phosphatase: 69 U/L (ref 38–126)
Anion gap: 10 (ref 5–15)
BUN: 9 mg/dL (ref 6–20)
CO2: 26 mmol/L (ref 22–32)
Calcium: 9 mg/dL (ref 8.9–10.3)
Chloride: 99 mmol/L (ref 98–111)
Creatinine, Ser: 0.59 mg/dL — ABNORMAL LOW (ref 0.61–1.24)
GFR, Estimated: >60 mL/min (ref >60)
Glucose, Bld: 145 mg/dL — ABNORMAL HIGH (ref 70–99)
Potassium: 2.6 mmol/L — CL (ref 3.5–5.1)
Sodium: 135 mmol/L (ref 135–145)
Total Bilirubin: 1.5 mg/dL — ABNORMAL HIGH (ref 0.3–1.2)
Total Protein: 6.8 g/dL (ref 6.5–8.1)

## 2020-10-28 LAB — CBC
HCT: 45.2 % (ref 39.0–52.0)
Hemoglobin: 15.5 g/dL (ref 13.0–17.0)
MCH: 30.4 pg (ref 26.0–34.0)
MCHC: 34.3 g/dL (ref 30.0–36.0)
MCV: 88.6 fL (ref 80.0–100.0)
Platelets: 133 10*3/uL — ABNORMAL LOW (ref 150–400)
RBC: 5.1 MIL/uL (ref 4.22–5.81)
RDW: 13.2 % (ref 11.5–15.5)
WBC: 11.8 10*3/uL — ABNORMAL HIGH (ref 4.0–10.5)
nRBC: 0 % (ref 0.0–0.2)

## 2020-10-28 LAB — MAGNESIUM: Magnesium: 1.7 mg/dL (ref 1.7–2.4)

## 2020-10-28 LAB — MRSA PCR SCREENING: MRSA by PCR: NEGATIVE

## 2020-10-28 LAB — GLUCOSE, CAPILLARY
Glucose-Capillary: 155 mg/dL — ABNORMAL HIGH (ref 70–99)
Glucose-Capillary: 116 mg/dL — ABNORMAL HIGH (ref 70–99)
Glucose-Capillary: 254 mg/dL — ABNORMAL HIGH (ref 70–99)

## 2020-10-28 MED ORDER — POTASSIUM CHLORIDE 10 MEQ/100ML IV SOLN
10.0000 meq | INTRAVENOUS | Status: DC
  Administered 2020-10-28 (×4): 10 meq via INTRAVENOUS
  Filled 2020-10-28 (×6): qty 100

## 2020-10-28 MED ORDER — HYDRALAZINE HCL 50 MG PO TABS
25.0000 mg | ORAL_TABLET | Freq: Once | ORAL | Status: AC
  Administered 2020-10-28: 25 mg via ORAL
  Filled 2020-10-28: qty 1

## 2020-10-28 NOTE — Progress Notes (Signed)
Patient repeatedly standing up at bedside with complaints about the food he's being served. Pt states "I'm reading to go to the house." Dr. Nelson Chimes notified that the patient would like to leave AMA. NT at beside to provide direct line of sight for patient safety.

## 2020-10-28 NOTE — Progress Notes (Signed)
PROGRESS NOTE    Micheal Wong  ZOX:[id number] DOB: Jul 12, 1971 DOA: 10/26/2020 PCP: Patient, No Pcp Per   Brief Narrative: Taken from H&P. Amori Colomb is a 50 y.o. male with medical history significant for alcohol abuse, prior history of alcoholic pancreatitis for which she was hospitalized in July 2021, tobacco use disorder, type 2 diabetes, essential hypertension, hyperlipidemia who presented to [facility] ED due to worsening nausea, vomiting, and abdominal pain of 1 day duration. Found to have mild leukocytosis, hyperglycemia, anion gap metabolic acidosis and elevated lipase at 363.  Patient continued to drink and his last alcohol use was day prior to starting symptoms.  CT abdomen consistent with acute pancreatitis.  Admitted for acute alcoholic pancreatitis. Also found to have new onset A. fib with RVR-initially requiring Cardizem infusion which was later discontinued.  Patient was converted back to sinus rhythm spontaneously and remained stable on metoprolol.  Cardiology wants to discuss long-term anticoagulation as an outpatient due to his history of alcohol abuse.  Subjective: Patient was sitting comfortably when seen today.  No more nausea and vomiting. Making some on appropriate commands regarding nursing staff and myself.  He was tolerating clear liquid diet which was started this morning very well and hoping to progress. We discussed that we will go slowly and I will progress it to full liquid for today and if he remains stable then we will progress to regular tomorrow.  Later received a message that patient become very aggressive stating that he does not like the diet here and wants to leave AMA.  Assessment & Plan:   Active Problems:   Acute alcoholic pancreatitis   Hyperglycemia   Atrial fibrillation with rapid ventricular response (HCC)  Acute alcoholic pancreatitis.  Prior admission with similar complaint in July 2021.  Patient is continue to drink.  CT was negative for ductal  dilatation, calcification or necrosis. Pain seems improving.  Tolerating clear liquid well -Continue with pain management -Advance diet to full liquid followed by as tolerated. -Continue with LR -Counseled again against alcohol cessation. -Can be transferred to MedSurg  New onset A. fib with RVR.  Converted back to sinus overnight. Cardiology was consulted-they are recommending continuation of metoprolol, and might consider p.o. Cardizem if needed.  They will discuss about long-term anticoagulation as an outpatient due to his history of alcohol abuse. Echocardiogram within normal limits. -Continue with metoprolol  Anion gap metabolic acidosis.  Resolved.  There was some concern of DKA but patient was not placed on Endo tool and improved with insulin and IV fluid.  Type 2 diabetes mellitus with hyperglycemia.  Poorly controlled diabetes with hyperglycemia and A1c of 10.5.  Most likely secondary to being noncompliant. CBG with some improvement. -Continue with twice daily Lantus. -Continue with sliding scale.  Hypertension.  Blood pressure remained elevated.  Patient was on amlodipine at home.   Ideally should be on ACE inhibitor or ARB but ACE inhibitor allergy is listed as swelling. -We will restart home dose of amlodipine as after Cardizem infusion now.  Elevated liver enzymes.  Most likely with alcohol use and alcoholic pancreatitis, started improving. -Continue to monitor  History of alcohol abuse.  Last use was 2 to 3 days ago so patient is high risk for withdrawal. -Continue with CIWA protocol. -Continue with thiamine, folic acid and multivitamin.  Tobacco use disorder. -Continue with nicotine patch  Objective: Vitals:   10/28/20 0900 10/28/20 1000 10/28/20 1100 10/28/20 1200  BP: (!) 160/111 135/84 (!) 143/98 125/88  Pulse: (!) 106 Marland Kitchen)  105 (!) 117 92  Resp: (!) 21 16 19 18   Temp: 98.6 F (37 C)     TempSrc: Oral     SpO2: 98% 96% 100% 96%  Weight:      Height:         Intake/Output Summary (Last 24 hours) at 10/28/2020 1357 Last data filed at 10/28/2020 0600 Gross per 24 hour  Intake 3482.88 ml  Output 1850 ml  Net 1632.88 ml   Filed Weights   10/26/20 1538  Weight: 93 kg    Examination:  General.  Well-developed gentleman, in no acute distress. Pulmonary.  Lungs clear bilaterally, normal respiratory effort. CV.  Regular rate and rhythm, no JVD, rub or murmur. Abdomen.  Soft, nontender, nondistended, BS positive. CNS.  Alert and oriented x3.  No focal neurologic deficit. Extremities.  No edema, no cyanosis, pulses intact and symmetrical. Psychiatry.  Judgment and insight appears normal.  DVT prophylaxis: Lovenox Code Status: Full Family Communication: Discussed with patient  Disposition Plan:  Status is: Inpatient  Remains inpatient appropriate because:Inpatient level of care appropriate due to severity of illness   Dispo:  Patient From: Home  Planned Disposition: Home  Medically stable for discharge: No   Level of care: Stepdown  All the records are reviewed and case discussed with Care Management/Social Worker. Management plans discussed with the patient, nursing and they are in agreement.  Consultants:   Cardiology  Procedures:  Antimicrobials:   Data Reviewed: I have personally reviewed following labs and imaging studies  CBC: Recent Labs  Lab 10/26/20 1540 10/26/20 2109 10/27/20 0359 10/28/20 0419  WBC 15.7* 18.1* 15.9* 11.8*  NEUTROABS  --   --  13.8*  --   HGB 15.2 15.2 15.4 15.5  HCT 45.4 44.3 44.1 45.2  MCV 89.4 88.8 87.8 88.6  PLT 190 184 147* 133*   Basic Metabolic Panel: Recent Labs  Lab 10/26/20 1540 10/26/20 2109 10/27/20 0359 10/28/20 0419  NA 136  --  138 135  K 4.0  --  3.5 2.6*  CL 96*  --  101 99  CO2 21*  --  25 26  GLUCOSE 432*  --  202* 145*  BUN 15  --  13 9  CREATININE 1.01  --  0.85 0.59*  CALCIUM 9.6  --  9.5 9.0  MG  --  1.6* 2.2 1.7  PHOS  --  3.9 2.6  --     GFR: Estimated Creatinine Clearance: 130.2 mL/min (A) (by C-G formula based on SCr of 0.59 mg/dL (L)). Liver Function Tests: Recent Labs  Lab 10/26/20 1540 10/27/20 0359 10/28/20 0419  AST 52* 98* 26  ALT 44 59* 32  ALKPHOS 80 72 69  BILITOT 1.3* 1.2 1.5*  PROT 7.9 7.4 6.8  ALBUMIN 5.0 4.5 3.7   Recent Labs  Lab 10/26/20 1540  LIPASE 363*   No results for input(s): AMMONIA in the last 168 hours. Coagulation Profile: No results for input(s): INR, PROTIME in the last 168 hours. Cardiac Enzymes: No results for input(s): CKTOTAL, CKMB, CKMBINDEX, TROPONINI in the last 168 hours. BNP (last 3 results) No results for input(s): PROBNP in the last 8760 hours. HbA1C: Recent Labs    10/26/20 2109  HGBA1C 10.5*   CBG: Recent Labs  Lab 10/27/20 1923 10/27/20 2343 10/28/20 0333 10/28/20 0726 10/28/20 1117  GLUCAP 222* 155* 155* 116* 254*   Lipid Profile: No results for input(s): CHOL, HDL, LDLCALC, TRIG, CHOLHDL, LDLDIRECT in the last 72 hours. Thyroid Function Tests:  No results for input(s): TSH, T4TOTAL, FREET4, T3FREE, THYROIDAB in the last 72 hours. Anemia Panel: No results for input(s): VITAMINB12, FOLATE, FERRITIN, TIBC, IRON, RETICCTPCT in the last 72 hours. Sepsis Labs: No results for input(s): PROCALCITON, LATICACIDVEN in the last 168 hours.  Recent Results (from the past 240 hour(s))  Resp Panel by RT-PCR (Flu A&B, Covid) Nasopharyngeal Swab     Status: None   Collection Time: 10/26/20  9:22 PM   Specimen: Nasopharyngeal Swab; Nasopharyngeal(NP) swabs in vial transport medium  Result Value Ref Range Status   SARS Coronavirus 2 by RT PCR NEGATIVE NEGATIVE Final    Comment: (NOTE) SARS-CoV-2 target nucleic acids are NOT DETECTED.  The SARS-CoV-2 RNA is generally detectable in upper respiratory specimens during the acute phase of infection. The lowest concentration of SARS-CoV-2 viral copies this assay can detect is 138 copies/mL. A negative result does  not preclude SARS-Cov-2 infection and should not be used as the sole basis for treatment or other patient management decisions. A negative result may occur with  improper specimen collection/handling, submission of specimen other than nasopharyngeal swab, presence of viral mutation(s) within the areas targeted by this assay, and inadequate number of viral copies(<138 copies/mL). A negative result must be combined with clinical observations, patient history, and epidemiological information. The expected result is Negative.  Fact Sheet for Patients:  BloggerCourse.comhttps://www.fda.gov/media/152166/download  Fact Sheet for Healthcare Providers:  SeriousBroker.ithttps://www.fda.gov/media/152162/download  This test is no t yet approved or cleared by the [place] FDA and  has been authorized for detection and/or diagnosis of SARS-CoV-2 by FDA under an Emergency Use Authorization (EUA). This EUA will remain  in effect (meaning this test can be used) for the duration of the COVID-19 declaration under Section 564(b)(1) of the Act, 21 U.S.C.section 360bbb-3(b)(1), unless the authorization is terminated  or revoked sooner.       Influenza A by PCR NEGATIVE NEGATIVE Final   Influenza B by PCR NEGATIVE NEGATIVE Final    Comment: (NOTE) The Xpert Xpress SARS-CoV-2/FLU/RSV plus assay is intended as an aid in the diagnosis of influenza from Nasopharyngeal swab specimens and should not be used as a sole basis for treatment. Nasal washings and aspirates are unacceptable for Xpert Xpress SARS-CoV-2/FLU/RSV testing.  Fact Sheet for Patients: BloggerCourse.comhttps://www.fda.gov/media/152166/download  Fact Sheet for Healthcare Providers: SeriousBroker.ithttps://www.fda.gov/media/152162/download  This test is not yet approved or cleared by the [place] FDA and has been authorized for detection and/or diagnosis of SARS-CoV-2 by FDA under an Emergency Use Authorization (EUA). This EUA will remain in effect (meaning this test can be used) for the  duration of the COVID-19 declaration under Section 564(b)(1) of the Act, 21 U.S.C. section 360bbb-3(b)(1), unless the authorization is terminated or revoked.  Performed at [facility], [address] [place], [place] [address]   MRSA PCR Screening     Status: None   Collection Time: 10/27/20 10:07 PM   Specimen: Nasopharyngeal  Result Value Ref Range Status   MRSA by PCR NEGATIVE NEGATIVE Final    Comment:        The GeneXpert MRSA Assay (FDA approved for NASAL specimens only), is one component of a comprehensive MRSA colonization surveillance program. It is not intended to diagnose MRSA infection nor to guide or monitor treatment for MRSA infections. Performed at [facility], [address] [place], [place] [address]      Radiology Studies: CT ABDOMEN PELVIS W CONTRAST  Result Date: 10/26/2020 CLINICAL DATA:  Abdominal pain, nausea, vomiting EXAM: CT ABDOMEN AND  PELVIS WITH CONTRAST TECHNIQUE: Multidetector CT imaging of the abdomen and pelvis was performed using the standard protocol following bolus administration of intravenous contrast. CONTRAST:  OMNIPAQUE IOHEXOL 300 MG/ML  SOLN COMPARISON:  02/27/2020 FINDINGS: Lower chest: No acute abnormality Hepatobiliary: Diffuse low-density throughout the liver compatible with fatty infiltration. No focal abnormality. Gallbladder unremarkable. Pancreas: Inflammation/stranding noted around the pancreas, most notable in the pancreatic head and uncinate process region compatible with acute pancreatitis. No ductal dilatation. Spleen: No focal abnormality.  Normal size. Adrenals/Urinary Tract: No adrenal abnormality. No focal renal abnormality. No stones or hydronephrosis. Urinary bladder is unremarkable. Stomach/Bowel: Normal appendix. Stomach, large and small bowel grossly unremarkable. Vascular/Lymphatic: Heavily calcified aorta and iliac vessels. No evidence of aneurysm or adenopathy. Reproductive: No visible  focal abnormality. Other: No free fluid or free air. Musculoskeletal: No acute bony abnormality. IMPRESSION: Inflammatory changes around the pancreatic head and uncinate process compatible with acute pancreatitis. Hepatic steatosis. Aortoiliac atherosclerosis. Electronically Signed   By: Charlett Nose M.D.   On: 10/26/2020 18:47   ECHOCARDIOGRAM COMPLETE  Result Date: 10/27/2020    ECHOCARDIOGRAM REPORT   Patient Name:   BRODRIC SCHAUER Date of Exam: 10/27/2020 Medical Rec #:  [id number]    Height:       71.0 in Accession #:    [id number]   Weight:       205.0 lb Date of Birth:  24-Aug-1970    BSA:          2.131 m Patient Age:    49 years     BP:           179/97 mmHg Patient Gender: M            HR:           103 bpm. Exam Location:  [facility] Procedure: 2D Echo, Color Doppler, Cardiac Doppler and Intracardiac            Opacification Agent Indications:     I48.91 Atrial fibrillation  History:         Patient has no prior history of Echocardiogram examinations.                  Risk Factors:Hypertension, Diabetes, Dyslipidemia and Current                  Smoker.  Sonographer:     Humphrey Rolls RDCS (AE) Referring Phys:  [id number] Oliver Pila HALL Diagnosing Phys: Harold Hedge MD  Sonographer Comments: Suboptimal apical window and no subcostal window. Image acquisition challenging due to patient body habitus. IMPRESSIONS  1. Left ventricular ejection fraction, by estimation, is 55 to 60%. The left ventricle has normal function. The left ventricle has no regional wall motion abnormalities. Left ventricular diastolic parameters were normal.  2. Right ventricular systolic function is normal. The right ventricular size is normal.  3. The mitral valve was not well visualized. Trivial mitral valve regurgitation.  4. The aortic valve was not well visualized. Aortic valve regurgitation is trivial. FINDINGS  Left Ventricle: Left ventricular ejection fraction, by estimation, is 55 to 60%. The left ventricle has normal function. The left  ventricle has no regional wall motion abnormalities. The left ventricular internal cavity size was normal in size. There is  no left ventricular hypertrophy. Left ventricular diastolic parameters were normal. Right Ventricle: The right ventricular size is normal. No increase in right ventricular wall thickness. Right ventricular systolic function is normal. Left Atrium: Left atrial size was normal in size. Right Atrium:  Right atrial size was normal in size. Pericardium: There is no evidence of pericardial effusion. Mitral Valve: The mitral valve was not well visualized. Trivial mitral valve regurgitation. MV peak gradient, 4.8 mmHg. The mean mitral valve gradient is 3.0 mmHg. Tricuspid Valve: The tricuspid valve is not well visualized. Tricuspid valve regurgitation is trivial. Aortic Valve: The aortic valve was not well visualized. Aortic valve regurgitation is trivial. Aortic valve mean gradient measures 4.0 mmHg. Aortic valve peak gradient measures 7.2 mmHg. Aortic valve area, by VTI measures 2.96 cm. Pulmonic Valve: The pulmonic valve was not well visualized. Pulmonic valve regurgitation is trivial. Aorta: The aortic root is normal in size and structure. IAS/Shunts: The interatrial septum was not assessed.  LEFT VENTRICLE PLAX 2D LVIDd:         5.70 cm      Diastology LVIDs:         4.40 cm      LV e' medial:    11.30 cm/s LV PW:         0.80 cm      LV E/e' medial:  9.0 LV IVS:        1.00 cm      LV e' lateral:   8.38 cm/s LVOT diam:     2.40 cm      LV E/e' lateral: 12.2 LV SV:         68 LV SV Index:   32 LVOT Area:     4.52 cm  LV Volumes (MOD) LV vol d, MOD A2C: 88.7 ml LV vol d, MOD A4C: 104.0 ml LV vol s, MOD A2C: 39.8 ml LV vol s, MOD A4C: 62.2 ml LV SV MOD A2C:     48.9 ml LV SV MOD A4C:     104.0 ml LV SV MOD BP:      46.9 ml LEFT ATRIUM         Index LA diam:    3.50 cm 1.64 cm/m  AORTIC VALVE                   PULMONIC VALVE AV Area (Vmax):    3.35 cm    PV Vmax:       0.73 m/s AV Area (Vmean):    3.02 cm    PV Vmean:      54.200 cm/s AV Area (VTI):     2.96 cm    PV VTI:        0.137 m AV Vmax:           134.00 cm/s PV Peak grad:  2.1 mmHg AV Vmean:          96.500 cm/s PV Mean grad:  1.0 mmHg AV VTI:            0.229 m AV Peak Grad:      7.2 mmHg AV Mean Grad:      4.0 mmHg LVOT Vmax:         99.20 cm/s LVOT Vmean:        64.500 cm/s LVOT VTI:          0.150 m LVOT/AV VTI ratio: 0.66  AORTA Ao Root diam: 3.00 cm MITRAL VALVE MV Area (PHT): 5.64 cm     SHUNTS MV Area VTI:   3.87 cm     Systemic VTI:  0.15 m MV Peak grad:  4.8 mmHg     Systemic Diam: 2.40 cm MV Mean grad:  3.0 mmHg MV Vmax:  1.10 m/s MV Vmean:      80.9 cm/s MV Decel Time: 135 msec MV E velocity: 102.00 cm/s Harold Hedge MD Electronically signed by Harold Hedge MD Signature Date/Time: 10/27/2020/12:03:15 PM    Final     Scheduled Meds: . Chlorhexidine Gluconate Cloth  6 each Topical Daily  . diazepam  10 mg Oral TID  . diltiazem  30 mg Oral QID  . enoxaparin (LOVENOX) injection  1 mg/kg Subcutaneous Q12H  . folic acid  1 mg Oral Daily  . hydrALAZINE  25 mg Oral Q8H  . insulin aspart  0-15 Units Subcutaneous Q4H  . insulin glargine  10 Units Subcutaneous BID  . metoprolol tartrate  25 mg Oral BID  . multivitamin with minerals  1 tablet Oral Daily  . nicotine  14 mg Transdermal Daily  . thiamine  100 mg Oral Daily   Or  . thiamine  100 mg Intravenous Daily   Continuous Infusions: . lactated ringers 150 mL/hr at 10/28/20 6578  . potassium chloride Stopped (10/28/20 1301)     LOS: 2 days   Time spent: 30 minutes More than 50% of the time was spent in counseling/coordination of care  Arnetha Courser, MD [facility]  If 7PM-7AM, please contact night-coverage [email or web address]  10/28/2020, 1:57 PM   This record has been created using Conservation officer, historic buildings. Errors have been sought and corrected,but may not always be located. Such creation errors do not reflect on the standard of care.

## 2020-10-28 NOTE — Progress Notes (Signed)
Patient alert and oriented x 4. States he is going to leave the hospital, that the food he was served for lunch "was the last straw." Dr. Nelson Chimes notified by charge nurse that patient requested to leave AMA. AMA forms signed by patient. IV removed by Thayer Ohm, NT. All belongings returned to patient.

## 2020-10-28 NOTE — Discharge Summary (Signed)
  Patient left AMA.  Please see today's progress note for further detail.

## 2020-10-28 NOTE — Progress Notes (Signed)
Patient Name: Micheal Wong Date of Encounter: 10/28/2020  Hospital Problem List     Active Problems:   Acute alcoholic pancreatitis   Hyperglycemia   Atrial fibrillation with rapid ventricular response [facility])    Patient Profile     50 y.o. male with history of alcohol abuse with prior history of alcoholic pancreatitis who was at admitted to [facility] with a diagnosis of alcoholic pancreatitis.  Complained of severe epigastric pain.  He presented to the emergency room where he was noted to have atrial fibrillation with rapid tracheal response.  Was loaded with IV Cardizem started on Cardizem drip.  He was started on a heparin drip.  He is currently converted back to sinus tachycardia with IV Cardizem.  Remains on heparin.  Remains hemodynamically stable.   Subjective   Less discomfort  Inpatient Medications    . Chlorhexidine Gluconate Cloth  6 each Topical Daily  . diazepam  10 mg Oral TID  . diltiazem  30 mg Oral QID  . enoxaparin (LOVENOX) injection  1 mg/kg Subcutaneous Q12H  . folic acid  1 mg Oral Daily  . hydrALAZINE  25 mg Oral Q8H  . insulin aspart  0-15 Units Subcutaneous Q4H  . insulin glargine  10 Units Subcutaneous BID  . metoprolol tartrate  25 mg Oral BID  . multivitamin with minerals  1 tablet Oral Daily  . nicotine  14 mg Transdermal Daily  . thiamine  100 mg Oral Daily   Or  . thiamine  100 mg Intravenous Daily    Vital Signs    Vitals:   10/28/20 0900 10/28/20 1000 10/28/20 1100 10/28/20 1200  BP: (!) 160/111 135/84 (!) 143/98 125/88  Pulse: (!) 106 (!) 105 (!) 117 92  Resp: (!) 21 16 19 18   Temp: 98.6 F (37 C)     TempSrc: Oral     SpO2: 98% 96% 100% 96%  Weight:      Height:        Intake/Output Summary (Last 24 hours) at 10/28/2020 1254 Last data filed at 10/28/2020 0600 Gross per 24 hour  Intake 3482.88 ml  Output 1850 ml  Net 1632.88 ml   Filed Weights   10/26/20 1538  Weight: 93 kg    Physical Exam    GEN: Well nourished, well  developed, in no acute distress.  HEENT: normal.  Neck: Supple, no JVD, carotid bruits, or masses. Cardiac: RRR, no murmurs, rubs, or gallops. No clubbing, cyanosis, edema.  Radials/DP/PT 2+ and equal bilaterally.  Respiratory:  Respirations regular and unlabored, clear to auscultation bilaterally. GI: Soft, nontender, nondistended, BS + x 4. MS: no deformity or atrophy. Skin: warm and dry, no rash. Neuro:  Strength and sensation are intact. Psych: Normal affect.  Labs    CBC Recent Labs    10/27/20 0359 10/28/20 0419  WBC 15.9* 11.8*  NEUTROABS 13.8*  --   HGB 15.4 15.5  HCT 44.1 45.2  MCV 87.8 88.6  PLT 147* 133*   Basic Metabolic Panel Recent Labs    10/30/20 2109 10/27/20 0359 10/28/20 0419  NA  --  138 135  K  --  3.5 2.6*  CL  --  101 99  CO2  --  25 26  GLUCOSE  --  202* 145*  BUN  --  13 9  CREATININE  --  0.85 0.59*  CALCIUM  --  9.5 9.0  MG 1.6* 2.2 1.7  PHOS 3.9 2.6  --    Liver Function  Tests Recent Labs    10/27/20 0359 10/28/20 0419  AST 98* 26  ALT 59* 32  ALKPHOS 72 69  BILITOT 1.2 1.5*  PROT 7.4 6.8  ALBUMIN 4.5 3.7   Recent Labs    10/26/20 1540  LIPASE 363*   Cardiac Enzymes No results for input(s): CKTOTAL, CKMB, CKMBINDEX, TROPONINI in the last 72 hours. BNP No results for input(s): BNP in the last 72 hours. D-Dimer No results for input(s): DDIMER in the last 72 hours. Hemoglobin A1C Recent Labs    10/26/20 2109  HGBA1C 10.5*   Fasting Lipid Panel No results for input(s): CHOL, HDL, LDLCALC, TRIG, CHOLHDL, LDLDIRECT in the last 72 hours. Thyroid Function Tests No results for input(s): TSH, T4TOTAL, T3FREE, THYROIDAB in the last 72 hours.  Invalid input(s): FREET3  Telemetry    nsr at present  ECG    Initially afib with rvr.   Radiology    CT ABDOMEN PELVIS W CONTRAST  Result Date: 10/26/2020 CLINICAL DATA:  Abdominal pain, nausea, vomiting EXAM: CT ABDOMEN AND PELVIS WITH CONTRAST TECHNIQUE: Multidetector  CT imaging of the abdomen and pelvis was performed using the standard protocol following bolus administration of intravenous contrast. CONTRAST:  OMNIPAQUE IOHEXOL 300 MG/ML  SOLN COMPARISON:  02/27/2020 FINDINGS: Lower chest: No acute abnormality Hepatobiliary: Diffuse low-density throughout the liver compatible with fatty infiltration. No focal abnormality. Gallbladder unremarkable. Pancreas: Inflammation/stranding noted around the pancreas, most notable in the pancreatic head and uncinate process region compatible with acute pancreatitis. No ductal dilatation. Spleen: No focal abnormality.  Normal size. Adrenals/Urinary Tract: No adrenal abnormality. No focal renal abnormality. No stones or hydronephrosis. Urinary bladder is unremarkable. Stomach/Bowel: Normal appendix. Stomach, large and small bowel grossly unremarkable. Vascular/Lymphatic: Heavily calcified aorta and iliac vessels. No evidence of aneurysm or adenopathy. Reproductive: No visible focal abnormality. Other: No free fluid or free air. Musculoskeletal: No acute bony abnormality. IMPRESSION: Inflammatory changes around the pancreatic head and uncinate process compatible with acute pancreatitis. Hepatic steatosis. Aortoiliac atherosclerosis. Electronically Signed   By: Charlett Nose M.D.   On: 10/26/2020 18:47   ECHOCARDIOGRAM COMPLETE  Result Date: 10/27/2020    ECHOCARDIOGRAM REPORT   Patient Name:   Micheal Wong Date of Exam: 10/27/2020 Medical Rec #:  [id number]    Height:       71.0 in Accession #:    [id number]   Weight:       205.0 lb Date of Birth:  12-Jul-1971    BSA:          2.131 m Patient Age:    49 years     BP:           179/97 mmHg Patient Gender: M            HR:           103 bpm. Exam Location:  [facility] Procedure: 2D Echo, Color Doppler, Cardiac Doppler and Intracardiac            Opacification Agent Indications:     I48.91 Atrial fibrillation  History:         Patient has no prior history of Echocardiogram examinations.                   Risk Factors:Hypertension, Diabetes, Dyslipidemia and Current                  Smoker.  Sonographer:     Humphrey Rolls RDCS (AE) Referring Phys:  [id number] Oliver Pila HALL Diagnosing Phys:  Harold Hedge MD  Sonographer Comments: Suboptimal apical window and no subcostal window. Image acquisition challenging due to patient body habitus. IMPRESSIONS  1. Left ventricular ejection fraction, by estimation, is 55 to 60%. The left ventricle has normal function. The left ventricle has no regional wall motion abnormalities. Left ventricular diastolic parameters were normal.  2. Right ventricular systolic function is normal. The right ventricular size is normal.  3. The mitral valve was not well visualized. Trivial mitral valve regurgitation.  4. The aortic valve was not well visualized. Aortic valve regurgitation is trivial. FINDINGS  Left Ventricle: Left ventricular ejection fraction, by estimation, is 55 to 60%. The left ventricle has normal function. The left ventricle has no regional wall motion abnormalities. The left ventricular internal cavity size was normal in size. There is  no left ventricular hypertrophy. Left ventricular diastolic parameters were normal. Right Ventricle: The right ventricular size is normal. No increase in right ventricular wall thickness. Right ventricular systolic function is normal. Left Atrium: Left atrial size was normal in size. Right Atrium: Right atrial size was normal in size. Pericardium: There is no evidence of pericardial effusion. Mitral Valve: The mitral valve was not well visualized. Trivial mitral valve regurgitation. MV peak gradient, 4.8 mmHg. The mean mitral valve gradient is 3.0 mmHg. Tricuspid Valve: The tricuspid valve is not well visualized. Tricuspid valve regurgitation is trivial. Aortic Valve: The aortic valve was not well visualized. Aortic valve regurgitation is trivial. Aortic valve mean gradient measures 4.0 mmHg. Aortic valve peak gradient measures 7.2 mmHg. Aortic  valve area, by VTI measures 2.96 cm. Pulmonic Valve: The pulmonic valve was not well visualized. Pulmonic valve regurgitation is trivial. Aorta: The aortic root is normal in size and structure. IAS/Shunts: The interatrial septum was not assessed.  LEFT VENTRICLE PLAX 2D LVIDd:         5.70 cm      Diastology LVIDs:         4.40 cm      LV e' medial:    11.30 cm/s LV PW:         0.80 cm      LV E/e' medial:  9.0 LV IVS:        1.00 cm      LV e' lateral:   8.38 cm/s LVOT diam:     2.40 cm      LV E/e' lateral: 12.2 LV SV:         68 LV SV Index:   32 LVOT Area:     4.52 cm  LV Volumes (MOD) LV vol d, MOD A2C: 88.7 ml LV vol d, MOD A4C: 104.0 ml LV vol s, MOD A2C: 39.8 ml LV vol s, MOD A4C: 62.2 ml LV SV MOD A2C:     48.9 ml LV SV MOD A4C:     104.0 ml LV SV MOD BP:      46.9 ml LEFT ATRIUM         Index LA diam:    3.50 cm 1.64 cm/m  AORTIC VALVE                   PULMONIC VALVE AV Area (Vmax):    3.35 cm    PV Vmax:       0.73 m/s AV Area (Vmean):   3.02 cm    PV Vmean:      54.200 cm/s AV Area (VTI):     2.96 cm    PV VTI:  0.137 m AV Vmax:           134.00 cm/s PV Peak grad:  2.1 mmHg AV Vmean:          96.500 cm/s PV Mean grad:  1.0 mmHg AV VTI:            0.229 m AV Peak Grad:      7.2 mmHg AV Mean Grad:      4.0 mmHg LVOT Vmax:         99.20 cm/s LVOT Vmean:        64.500 cm/s LVOT VTI:          0.150 m LVOT/AV VTI ratio: 0.66  AORTA Ao Root diam: 3.00 cm MITRAL VALVE MV Area (PHT): 5.64 cm     SHUNTS MV Area VTI:   3.87 cm     Systemic VTI:  0.15 m MV Peak grad:  4.8 mmHg     Systemic Diam: 2.40 cm MV Mean grad:  3.0 mmHg MV Vmax:       1.10 m/s MV Vmean:      80.9 cm/s MV Decel Time: 135 msec MV E velocity: 102.00 cm/s Harold HedgeKenneth Beena Catano MD Electronically signed by Harold HedgeKenneth Wrenna Saks MD Signature Date/Time: 10/27/2020/12:03:15 PM    Final     Assessment & Plan    50 year old male with history of alcohol abuse and alcoholic pancreatitis admitted with abdominal pain consistent with alcoholic pancreatitis.   Was noted to be in atrial fibrillation rapid ventricular response.  Has converted to sinus rhythm with Cardizem.  Has been on heparin.  Will need to continue with Cardizem.  Will convert to p.o.  Concern over possible alcohol abuse continuing in the face of alcohol abuse he is concerning.  I have discussed this briefly with him.  We will do this prior to discharge at this point will defer chronic anticoagulation until I can confirm the patient will be compliant with his medications and limit alcohol intake.  A. fib is likely secondary to alcohol abuse and pancreatitis.  Has not had this in the past and this may be a transient event. Remains in nsr on 25 mg of metoprolol. Would continue with this for now.   Signed, Darlin PriestlyKenneth A. Nadav Swindell MD 10/28/2020, 12:54 PM  Pager: [phone]

## 2021-06-16 IMAGING — US US ABDOMEN LIMITED
1 series · 14 of 25 positions shown · non-contrast
Comparison: None.

CLINICAL DATA: Pancreatitis.

EXAM:
ULTRASOUND ABDOMEN LIMITED RIGHT UPPER QUADRANT

[Series 1: us abdomen limited · 0.25mm/px · 14 of 43 slices shown]
[im 1/43]
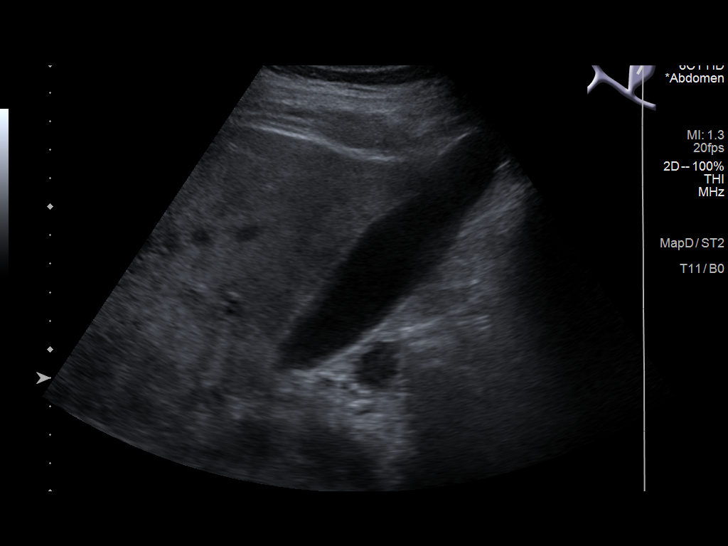
[im 4/43]
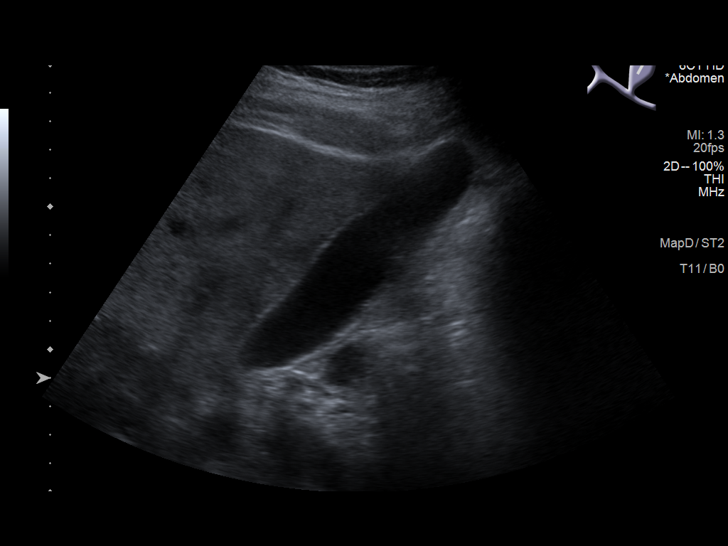
[im 8/43]
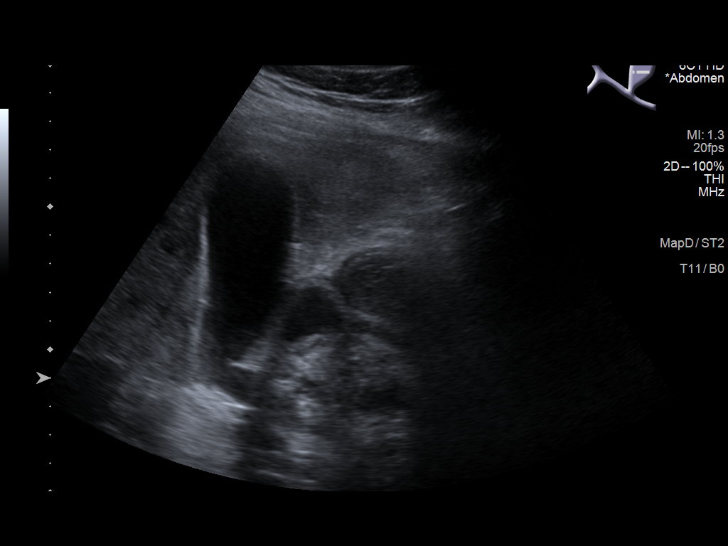
[im 11/43]
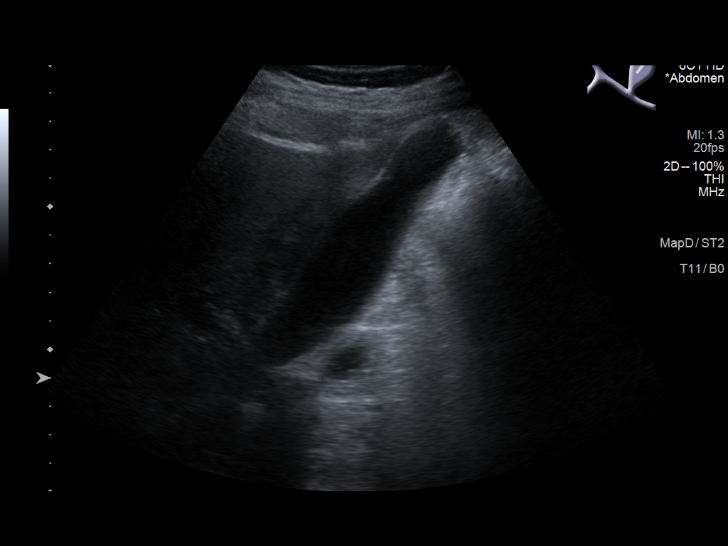
[im 15/43]
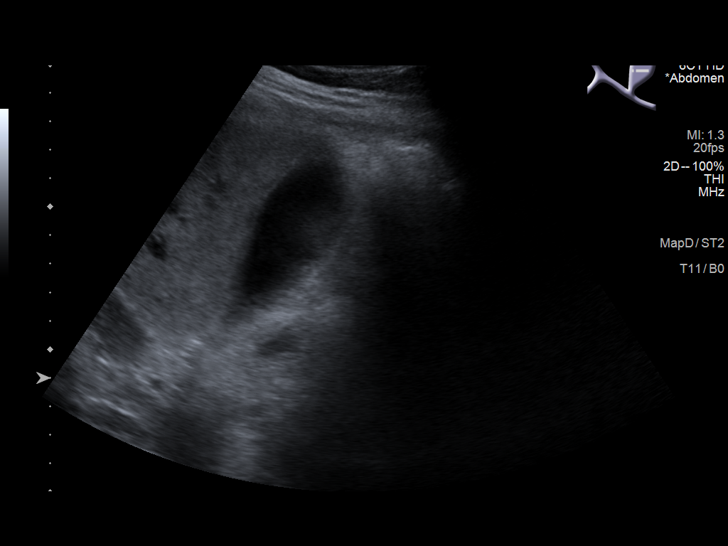
[im 16/43]
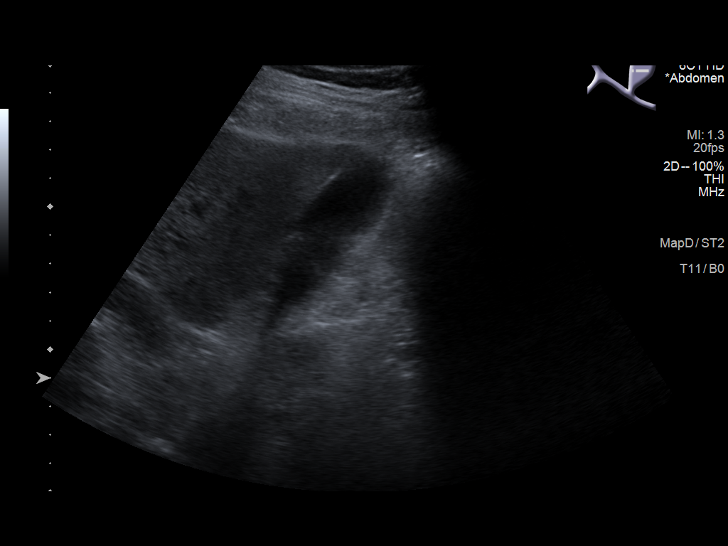
[im 20/43]
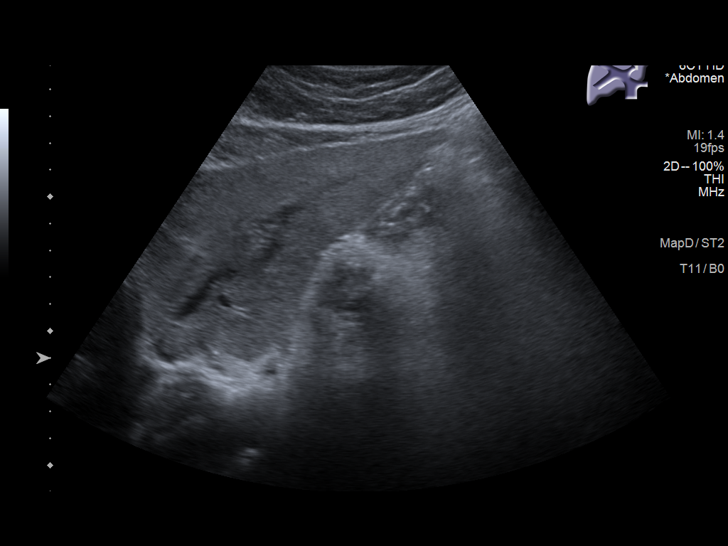
[im 23/43]
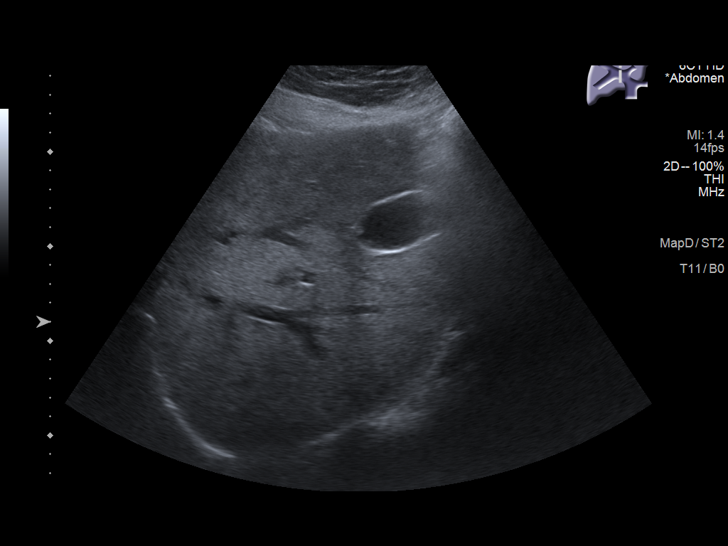
[im 27/43]
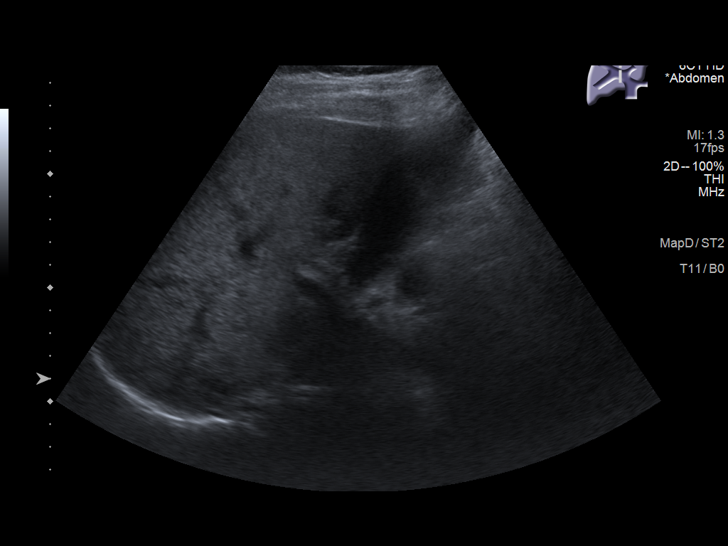
[im 29/43]
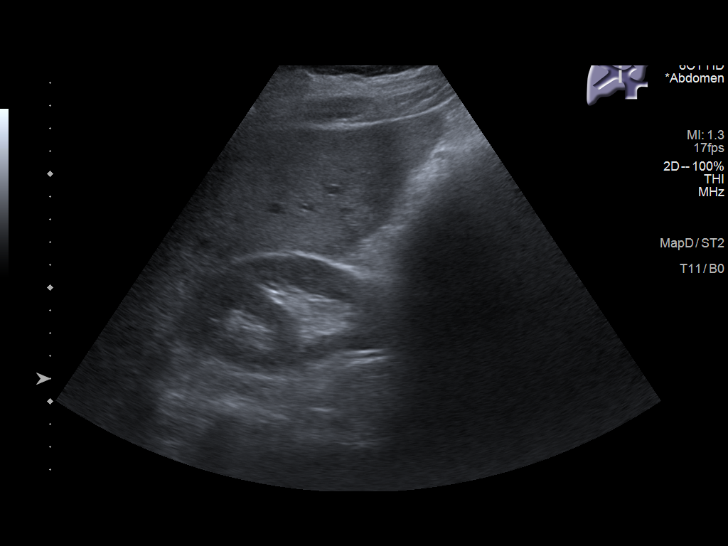
[im 32/43]
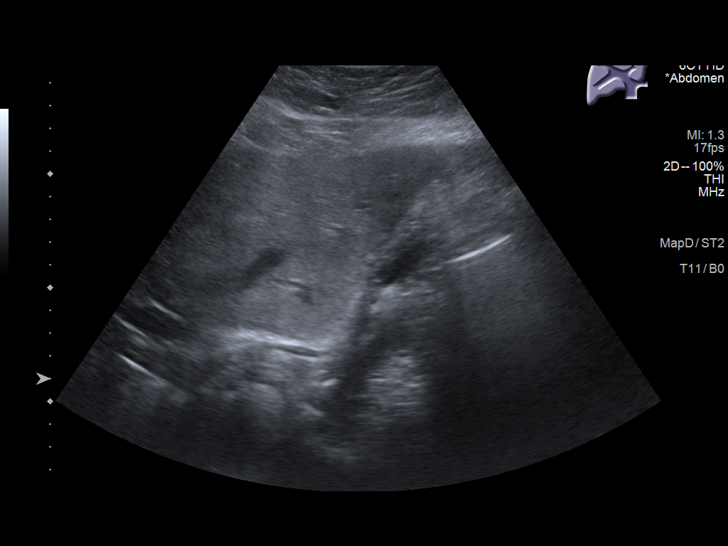
[im 36/43]
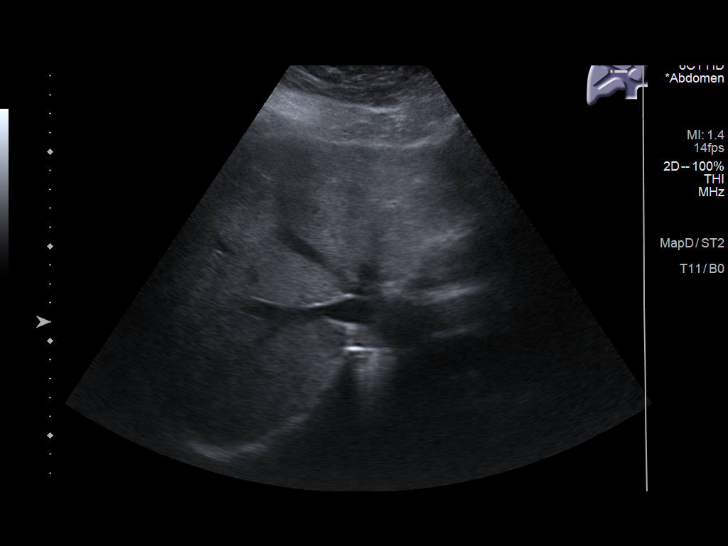
[im 39/43]
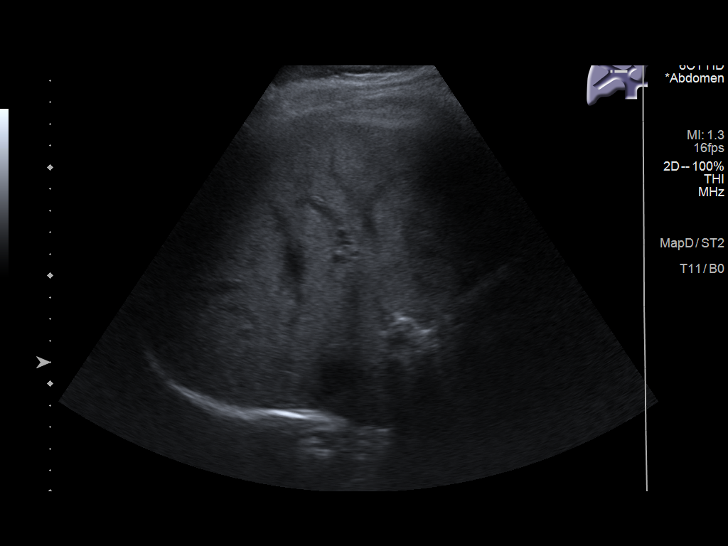
[im 43/43]
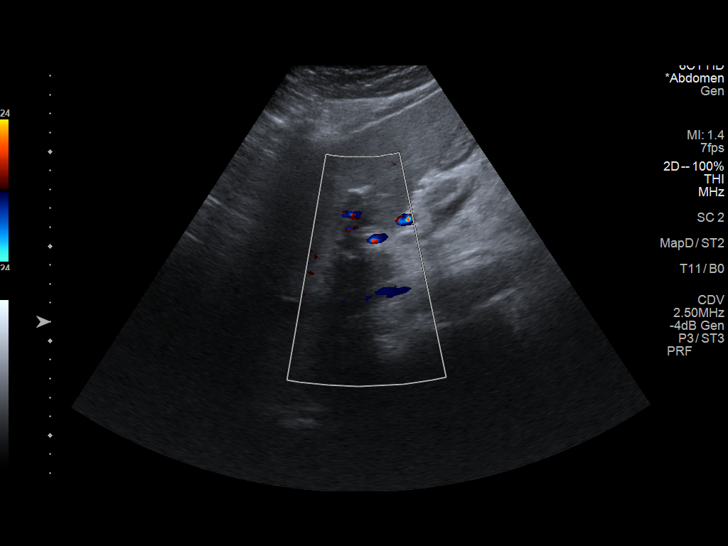

[14 of 25 positions shown; findings below may reference images not displayed]

FINDINGS: Gallbladder:

No gallstones or wall thickening visualized. No sonographic Murphy
sign noted by sonographer.

Common bile duct:

Diameter: 5.1 mm.

Liver:

Mild increased parenchymal echogenicity likely due to degree of
steatosis without focal mass. No free fluid.

Other: None.
IMPRESSION: 1.  No acute findings.

2.  Suggestion of mild hepatic steatosis.

## 2021-07-19 ENCOUNTER — Encounter: Payer: Self-pay | Admitting: Urology

## 2021-07-19 ENCOUNTER — Ambulatory Visit: Payer: BC Managed Care – PPO | Admitting: Urology

## 2021-07-19 NOTE — Progress Notes (Deleted)
07/19/2021 7:31 AM   Micheal Wong 1971/01/16 [id number]  Referring provider: Enid Baas, MD [address] [place],  [place] [address]  No chief complaint on file.   HPI: Micheal Wong is a 50 y.o. male referred for evaluation of erectile dysfunction.   Organic risk factors include hypertension, diabetes mellitus, hyperlipidemia, antihypertensive medications and current tobacco use Currently using sildenafil 100 mg Prior treatments   PMH: Past Medical History:  Diagnosis Date   Diabetes mellitus without complication (HCC)    Hypertension    Pancreatitis     Surgical History: No past surgical history on file.  Home Medications:  Allergies as of 07/19/2021       Reactions   Drug Class [ace Inhibitors] Swelling   Swelling        Medication List        Accurate as of July 19, 2021  7:31 AM. If you have any questions, ask your nurse or doctor.          amLODipine 10 MG tablet Commonly known as: NORVASC Take 10 mg by mouth daily.   atorvastatin 10 MG tablet Commonly known as: LIPITOR Take 1 tablet (10 mg total) by mouth daily. Does not know dose   fenofibrate 48 MG tablet Commonly known as: TRICOR Take 48 mg by mouth daily. Does not know dose   insulin glargine 100 UNIT/ML injection Commonly known as: LANTUS Inject 30 Units into the skin daily.   metFORMIN 500 MG tablet Commonly known as: GLUCOPHAGE Take 500 mg by mouth daily with breakfast. Not sure of the dose   oxyCODONE 5 MG immediate release tablet Commonly known as: Oxy IR/ROXICODONE Take 1 tablet (5 mg total) by mouth every 6 (six) hours as needed for severe pain.   thiamine 100 MG tablet Take 1 tablet (100 mg total) by mouth daily.        Allergies:  Allergies  Allergen Reactions   Drug Class [Ace Inhibitors] Swelling    Swelling    Family History: No family history on file.  Social History:  reports that he has been smoking cigarettes. He has been smoking  an average of .5 packs per day. He has never used smokeless tobacco. He reports current alcohol use. He reports that he does not currently use drugs.   Physical Exam: There were no vitals taken for this visit.  Constitutional:  Alert and oriented, No acute distress. HEENT: [place] AT, moist mucus membranes.  Trachea midline, no masses. Cardiovascular: No clubbing, cyanosis, or edema. Respiratory: Normal respiratory effort, no increased work of breathing. GI: Abdomen is soft, nontender, nondistended, no abdominal masses GU: No CVA tenderness Skin: No rashes, bruises or suspicious lesions. Neurologic: Grossly intact, no focal deficits, moving all 4 extremities. Psychiatric: Normal mood and affect.  Laboratory Data: Lab Results  Component Value Date   WBC 11.8 (H) 10/28/2020   HGB 15.5 10/28/2020   HCT 45.2 10/28/2020   MCV 88.6 10/28/2020   PLT 133 (L) 10/28/2020    Lab Results  Component Value Date   CREATININE 0.59 (L) 10/28/2020    No results found for: PSA  No results found for: TESTOSTERONE  Lab Results  Component Value Date   HGBA1C 10.5 (H) 10/26/2020    Urinalysis    Component Value Date/Time   COLORURINE STRAW (A) 10/26/2020 1816   APPEARANCEUR CLEAR (A) 10/26/2020 1816   LABSPEC >1.046 (H) 10/26/2020 1816   PHURINE 5.0 10/26/2020 1816   GLUCOSEU >=500 (A) 10/26/2020 1816  HGBUR SMALL (A) 10/26/2020 1816   BILIRUBINUR NEGATIVE 10/26/2020 1816   KETONESUR 20 (A) 10/26/2020 1816   PROTEINUR NEGATIVE 10/26/2020 1816   NITRITE NEGATIVE 10/26/2020 1816   LEUKOCYTESUR NEGATIVE 10/26/2020 1816    Lab Results  Component Value Date   BACTERIA NONE SEEN 10/26/2020    Pertinent Imaging: *** No results found for this or any previous visit.  No results found for this or any previous visit.  No results found for this or any previous visit.  No results found for this or any previous visit.  No results found for this or any previous visit.  No results found  for this or any previous visit.  No results found for this or any previous visit.  No results found for this or any previous visit.   Assessment & Plan:    There are no diagnoses linked to this encounter.  No follow-ups on file.  Riki Altes, MD  [facility] [address], [address] [place], [place] [address] [phone]

## 2021-10-04 ENCOUNTER — Other Ambulatory Visit: Payer: Self-pay | Admitting: Internal Medicine

## 2021-10-06 LAB — SURGICAL PATHOLOGY

## 2022-07-26 ENCOUNTER — Other Ambulatory Visit (INDEPENDENT_AMBULATORY_CARE_PROVIDER_SITE_OTHER): Payer: Self-pay | Admitting: Nurse Practitioner

## 2022-07-26 DIAGNOSIS — I739 Peripheral vascular disease, unspecified: Secondary | ICD-10-CM

## 2022-07-27 ENCOUNTER — Encounter (INDEPENDENT_AMBULATORY_CARE_PROVIDER_SITE_OTHER): Payer: BC Managed Care – PPO | Admitting: Nurse Practitioner

## 2022-07-27 ENCOUNTER — Encounter (INDEPENDENT_AMBULATORY_CARE_PROVIDER_SITE_OTHER): Payer: BC Managed Care – PPO

## 2022-09-13 ENCOUNTER — Encounter (INDEPENDENT_AMBULATORY_CARE_PROVIDER_SITE_OTHER): Payer: BC Managed Care – PPO | Admitting: Vascular Surgery

## 2022-09-13 ENCOUNTER — Encounter (INDEPENDENT_AMBULATORY_CARE_PROVIDER_SITE_OTHER): Payer: BC Managed Care – PPO

## 2022-10-30 ENCOUNTER — Emergency Department
Admission: EM | Admit: 2022-10-30 | Discharge: 2022-10-30 | Disposition: A | Discharge status: Home / Self Care | Payer: Managed Care, Other (non HMO) | Attending: Emergency Medicine | Admitting: Emergency Medicine

## 2022-10-30 ENCOUNTER — Other Ambulatory Visit: Payer: Self-pay

## 2022-10-30 ENCOUNTER — Encounter: Payer: Self-pay | Admitting: Emergency Medicine

## 2022-10-30 DIAGNOSIS — L02811 Cutaneous abscess of head [any part, except face]: Secondary | ICD-10-CM | POA: Insufficient documentation

## 2022-10-30 NOTE — ED Provider Notes (Signed)
[facility] Provider Note  Patient Contact: 5:45 PM (approximate)   History   Abscess   HPI  Micheal Wong is a 52 y.o. male who presents the emergency department concern for possible worsening of an abscess to his scalp.  Patient had a telemedicine visit, placed on Keflex and doxycycline 5 days ago.  Patient states that the pain, swelling has drastically improved but the skin seems to be "breaking down" and patient wants to be evaluated.  No fevers, chills.  It does appear that patient has had some issues with recurring skin infections.  Never been officially diagnosed with MRSA.     Physical Exam   Triage Vital Signs: ED Triage Vitals  Enc Vitals Group     BP 10/30/22 1709 (!) 147/107     Pulse Rate 10/30/22 1709 (!) 102     Resp 10/30/22 1709 20     Temp 10/30/22 1709 98.2 F (36.8 C)     Temp Source 10/30/22 1709 Oral     SpO2 10/30/22 1709 98 %     Weight 10/30/22 1708 205 lb (93 kg)     Height 10/30/22 1708 5\' 10"  (1.778 m)     Head Circumference --      Peak Flow --      Pain Score 10/30/22 1708 5     Pain Loc --      Pain Edu? --      Excl. in [place]? --     Most recent vital signs: Vitals:   10/30/22 1709  BP: (!) 147/107  Pulse: (!) 102  Resp: 20  Temp: 98.2 F (36.8 C)  SpO2: 98%     General: Alert and in no acute distress. Head: No acute traumatic findings.  Patient has a lesion to the scalp that appears to be in the process of healing.  Appears that patient had an abscess that is starting to heal.  There is no frank purulent drainage though there is some dried scabbing consistent with previous purulent drainage.  No palpable lymphadenopathy in the posterior cervical chain.  Cardiovascular:  Good peripheral perfusion Respiratory: Normal respiratory effort without tachypnea or retractions. Lungs CTAB.  Musculoskeletal: Full range of motion to all extremities.  Neurologic:  No gross focal neurologic deficits are appreciated.   Skin:   No rash noted Other:   ED Results / Procedures / Treatments   Labs (all labs ordered are listed, but only abnormal results are displayed) Labs Reviewed - No data to display   EKG     RADIOLOGY   No results found.  PROCEDURES:  Critical Care performed: No  Procedures   MEDICATIONS ORDERED IN ED: Medications - No data to display   IMPRESSION / MDM / [place] / ED COURSE  I reviewed the triage vital signs and the nursing notes.                                 Differential diagnosis includes, but is not limited to, abscess, cellulitis, kerion, tinea capitis  Patient's presentation is most consistent with acute presentation with potential threat to life or bodily function.   Patient's diagnosis is consistent with abscess of the scalp.  Patient presents emergency department for evaluation of an area to the scalp.  Patient had been diagnosed with an abscess to the scalp, placed on doxycycline and Keflex.  Patient states that overall the symptoms have been improving  in regards to pain, erythema, edema but the skin appears to be more open than was previous.  It appears that patient has an appropriately healing abscess that was draining.  No concerns warranting evaluation with imaging currently.  No indication for labs.  Patient is encouraged to continue his current antibiotic regimen as he has a 10-day course and still has 5 days left.  Concerning signs and symptoms and follow-up have been discussed with the patient.  Given his history of recurrent skin infections with the most have not required antibiotics I do recommend follow-up with dermatology to ensure no evidence of colonization with MRSA following his treatment.  Patient is agreeable with this plan.  Will follow-up primary care as needed..  Patient is given ED precautions to return to the ED for any worsening or new symptoms.     FINAL CLINICAL IMPRESSION(S) / ED DIAGNOSES   Final diagnoses:   Abscess of scalp     Rx / DC Orders   ED Discharge Orders     None        Note:  This document was prepared using Dragon voice recognition software and may include unintentional dictation errors.   Darletta Moll, PA-C 10/30/22 1814    Naaman Plummer, MD 10/30/22 [phone]

## 2022-11-18 ENCOUNTER — Encounter (INDEPENDENT_AMBULATORY_CARE_PROVIDER_SITE_OTHER): Payer: Self-pay | Admitting: Nurse Practitioner

## 2022-11-18 ENCOUNTER — Encounter (INDEPENDENT_AMBULATORY_CARE_PROVIDER_SITE_OTHER): Payer: Self-pay

## 2022-12-06 ENCOUNTER — Encounter (INDEPENDENT_AMBULATORY_CARE_PROVIDER_SITE_OTHER): Payer: Self-pay | Admitting: Nurse Practitioner

## 2022-12-06 ENCOUNTER — Encounter (INDEPENDENT_AMBULATORY_CARE_PROVIDER_SITE_OTHER): Payer: Self-pay

## 2022-12-14 ENCOUNTER — Encounter (INDEPENDENT_AMBULATORY_CARE_PROVIDER_SITE_OTHER): Payer: Self-pay

## 2022-12-14 ENCOUNTER — Encounter (INDEPENDENT_AMBULATORY_CARE_PROVIDER_SITE_OTHER): Payer: Self-pay | Admitting: Nurse Practitioner

## 2022-12-16 ENCOUNTER — Other Ambulatory Visit: Payer: Self-pay | Admitting: Internal Medicine

## 2022-12-16 DIAGNOSIS — R55 Syncope and collapse: Secondary | ICD-10-CM

## 2023-03-11 ENCOUNTER — Emergency Department
Admission: EM | Admit: 2023-03-11 | Discharge: 2023-03-11 | Disposition: A | Discharge status: Home / Self Care | Payer: Managed Care, Other (non HMO) | Attending: Emergency Medicine | Admitting: Emergency Medicine

## 2023-03-11 ENCOUNTER — Other Ambulatory Visit: Payer: Self-pay

## 2023-03-11 DIAGNOSIS — I10 Essential (primary) hypertension: Secondary | ICD-10-CM | POA: Insufficient documentation

## 2023-03-11 DIAGNOSIS — R21 Rash and other nonspecific skin eruption: Secondary | ICD-10-CM | POA: Diagnosis present

## 2023-03-11 DIAGNOSIS — E119 Type 2 diabetes mellitus without complications: Secondary | ICD-10-CM | POA: Diagnosis not present

## 2023-03-11 DIAGNOSIS — L731 Pseudofolliculitis barbae: Secondary | ICD-10-CM | POA: Insufficient documentation

## 2023-03-11 MED ORDER — MUPIROCIN 2 % EX OINT: 1.0000 | TOPICAL_OINTMENT | Freq: Two times a day (BID) | CUTANEOUS | 0 refills | Status: DC

## 2023-03-11 MED ORDER — TRIAMCINOLONE ACETONIDE 0.5 % EX OINT: 1.0000 | TOPICAL_OINTMENT | Freq: Two times a day (BID) | CUTANEOUS | 0 refills | Status: DC

## 2023-03-11 NOTE — ED Triage Notes (Signed)
Pt presents to ED with c/o of "possible ingrown hair on the back of my head". Pt states "MD live MD told me to come if it didn't heal after 3rd round of ABX" NAD noted. Minimal spot seen on head.

## 2023-03-11 NOTE — ED Provider Notes (Signed)
[facility] Provider Note    Event Date/Time   First MD Initiated Contact with Patient 03/11/23 1245     (approximate)   History   Abscess   HPI  Micheal Wong is a 52 y.o. male with history of pancreatitis, diabetes, hypertension presents emergency department with possible ingrown hair on the back of his head.  Patient goes to a Paediatric nurse and gets his head shaved every week.  Has had 2 rounds of antibiotics without any relief.  Also tried to use a dandruff shampoo.  His doctor told him he needed to be seen prior to starting any additional antibiotics.  Denies fever or chills      Physical Exam   Triage Vital Signs: ED Triage Vitals [03/11/23 1237]  Encounter Vitals Group     BP 123/85     Systolic BP Percentile      Diastolic BP Percentile      Pulse Rate 98     Resp 18     Temp 98.6 F (37 C)     Temp Source Oral     SpO2 99 %     Weight      Height      Head Circumference      Peak Flow      Pain Score 3     Pain Loc      Pain Education      Exclude from Growth Chart     Most recent vital signs: Vitals:   03/11/23 1237  BP: 123/85  Pulse: 98  Resp: 18  Temp: 98.6 F (37 C)  SpO2: 99%     General: Awake, no distress.   CV:  Good peripheral perfusion. regular rate and  rhythm Resp:  Normal effort.  Abd:  No distention.   Other:  Skin with possible ingrown hairs noted on the back of the scalp, some scarring noted from previous abscesses, neurovascular is intact, no obvious abscess or ingrown hair noted at this time   ED Results / Procedures / Treatments   Labs (all labs ordered are listed, but only abnormal results are displayed) Labs Reviewed - No data to display   EKG     RADIOLOGY     PROCEDURES:   Procedures   MEDICATIONS ORDERED IN ED: Medications - No data to display   IMPRESSION / MDM / ASSESSMENT AND PLAN / ED COURSE  I reviewed the triage vital signs and the nursing notes.                               Differential diagnosis includes, but is not limited to, infected ingrown hair, ingrown hair, razor rash  Patient's presentation is most consistent with acute, uncomplicated illness.   I did explain findings to the patient.  I did use tweezers to try and see if there were any ingrown hairs in the area.  None noted at this time.  Feel that he should follow-up with a dermatologist.  He should use Selsun Blue or head and shoulders daily.  Use a loofah to exfoliate the skin.  He can apply Bactroban and triamcinolone cream to the affected areas to see if there is any improvement.  If he begins to get a large abscess he should return emergency department.      FINAL CLINICAL IMPRESSION(S) / ED DIAGNOSES   Final diagnoses:  Ingrown hair  Rash and nonspecific skin eruption  Rx / DC Orders   ED Discharge Orders          Ordered    mupirocin ointment (BACTROBAN) 2 %  2 times daily        03/11/23 1314    triamcinolone ointment (KENALOG) 0.5 %  2 times daily        03/11/23 1314             Note:  This document was prepared using Dragon voice recognition software and may include unintentional dictation errors.    Faythe Ghee, PA-C 03/11/23 1318    Loleta Rose, MD 03/11/23 [phone]

## 2023-03-11 NOTE — Discharge Instructions (Signed)
Use selsun blue  or head and shoulders daily Use  a  loofa to exfoliate the skin on your scalp Follow up with dermatology

## 2024-02-04 ENCOUNTER — Emergency Department

## 2024-02-04 ENCOUNTER — Observation Stay
Admission: EM | Admit: 2024-02-04 | Discharge: 2024-02-06 | Disposition: A | Discharge status: Home / Self Care | Attending: Family Medicine | Admitting: Family Medicine

## 2024-02-04 ENCOUNTER — Other Ambulatory Visit: Payer: Self-pay

## 2024-02-04 DIAGNOSIS — Z7982 Long term (current) use of aspirin: Secondary | ICD-10-CM | POA: Diagnosis not present

## 2024-02-04 DIAGNOSIS — E162 Hypoglycemia, unspecified: Secondary | ICD-10-CM | POA: Diagnosis present

## 2024-02-04 DIAGNOSIS — E1140 Type 2 diabetes mellitus with diabetic neuropathy, unspecified: Secondary | ICD-10-CM | POA: Insufficient documentation

## 2024-02-04 DIAGNOSIS — F10920 Alcohol use, unspecified with intoxication, uncomplicated: Secondary | ICD-10-CM | POA: Insufficient documentation

## 2024-02-04 DIAGNOSIS — E1142 Type 2 diabetes mellitus with diabetic polyneuropathy: Secondary | ICD-10-CM | POA: Diagnosis not present

## 2024-02-04 DIAGNOSIS — K6289 Other specified diseases of anus and rectum: Secondary | ICD-10-CM | POA: Diagnosis not present

## 2024-02-04 DIAGNOSIS — I10 Essential (primary) hypertension: Secondary | ICD-10-CM | POA: Diagnosis present

## 2024-02-04 DIAGNOSIS — D649 Anemia, unspecified: Secondary | ICD-10-CM | POA: Diagnosis not present

## 2024-02-04 DIAGNOSIS — I5020 Unspecified systolic (congestive) heart failure: Secondary | ICD-10-CM | POA: Diagnosis not present

## 2024-02-04 DIAGNOSIS — G8929 Other chronic pain: Secondary | ICD-10-CM | POA: Insufficient documentation

## 2024-02-04 DIAGNOSIS — I110 Hypertensive heart disease with heart failure: Secondary | ICD-10-CM | POA: Insufficient documentation

## 2024-02-04 DIAGNOSIS — E785 Hyperlipidemia, unspecified: Secondary | ICD-10-CM

## 2024-02-04 DIAGNOSIS — Z87891 Personal history of nicotine dependence: Secondary | ICD-10-CM | POA: Insufficient documentation

## 2024-02-04 DIAGNOSIS — Z794 Long term (current) use of insulin: Secondary | ICD-10-CM | POA: Insufficient documentation

## 2024-02-04 DIAGNOSIS — E11649 Type 2 diabetes mellitus with hypoglycemia without coma: Principal | ICD-10-CM | POA: Insufficient documentation

## 2024-02-04 DIAGNOSIS — K629 Disease of anus and rectum, unspecified: Secondary | ICD-10-CM

## 2024-02-04 LAB — COMPREHENSIVE METABOLIC PANEL WITH GFR
ALT: 112 U/L — ABNORMAL HIGH (ref 0–44)
AST: 230 U/L — ABNORMAL HIGH (ref 15–41)
Albumin: 3 g/dL — ABNORMAL LOW (ref 3.5–5.0)
Alkaline Phosphatase: 176 U/L — ABNORMAL HIGH (ref 38–126)
Anion gap: 11 (ref 5–15)
BUN: 5 mg/dL — ABNORMAL LOW (ref 6–20)
CO2: 23 mmol/L (ref 22–32)
Calcium: 8.1 mg/dL — ABNORMAL LOW (ref 8.9–10.3)
Chloride: 104 mmol/L (ref 98–111)
Creatinine, Ser: 0.8 mg/dL (ref 0.61–1.24)
GFR, Estimated: >60 mL/min (ref >60)
Glucose, Bld: 161 mg/dL — ABNORMAL HIGH (ref 70–99)
Potassium: 3.5 mmol/L (ref 3.5–5.1)
Sodium: 138 mmol/L (ref 135–145)
Total Bilirubin: 0.6 mg/dL (ref 0.0–1.2)
Total Protein: 6 g/dL — ABNORMAL LOW (ref 6.5–8.1)

## 2024-02-04 LAB — CBC
HCT: 37 % — ABNORMAL LOW (ref 39.0–52.0)
Hemoglobin: 11.9 g/dL — ABNORMAL LOW (ref 13.0–17.0)
MCH: 31 pg (ref 26.0–34.0)
MCHC: 32.2 g/dL (ref 30.0–36.0)
MCV: 96.4 fL (ref 80.0–100.0)
Platelets: 157 10*3/uL (ref 150–400)
RBC: 3.84 MIL/uL — ABNORMAL LOW (ref 4.22–5.81)
RDW: 13.3 % (ref 11.5–15.5)
WBC: 7.1 10*3/uL (ref 4.0–10.5)
nRBC: 0 % (ref 0.0–0.2)

## 2024-02-04 LAB — CBG MONITORING, ED
Glucose-Capillary: 142 mg/dL — ABNORMAL HIGH (ref 70–99)
Glucose-Capillary: 83 mg/dL (ref 70–99)
Glucose-Capillary: 42 mg/dL — CL (ref 70–99)
Glucose-Capillary: 50 mg/dL — ABNORMAL LOW (ref 70–99)
Glucose-Capillary: 59 mg/dL — ABNORMAL LOW (ref 70–99)
Glucose-Capillary: 81 mg/dL (ref 70–99)
Glucose-Capillary: 80 mg/dL (ref 70–99)

## 2024-02-04 LAB — GLUCOSE, CAPILLARY: Glucose-Capillary: 69 mg/dL — ABNORMAL LOW (ref 70–99)

## 2024-02-04 MED ORDER — KETOROLAC TROMETHAMINE 30 MG/ML IJ SOLN
30.0000 mg | Freq: Once | INTRAMUSCULAR | Status: AC
  Administered 2024-02-04: 30 mg via INTRAVENOUS
  Filled 2024-02-04: qty 1

## 2024-02-04 MED ORDER — INSULIN ASPART 100 UNIT/ML IJ SOLN: 0.0000 [IU] | Freq: Every day | INTRAMUSCULAR | Status: DC

## 2024-02-04 MED ORDER — ACETAMINOPHEN 650 MG RE SUPP
650.0000 mg | Freq: Four times a day (QID) | RECTAL | Status: DC | PRN
Start: 2024-02-04 — End: 2024-02-06

## 2024-02-04 MED ORDER — FENOFIBRATE 54 MG PO TABS
54.0000 mg | ORAL_TABLET | Freq: Every day | ORAL | Status: DC
  Administered 2024-02-05: 54 mg via ORAL
  Filled 2024-02-04 (×2): qty 1

## 2024-02-04 MED ORDER — INSULIN ASPART 100 UNIT/ML IJ SOLN
0.0000 [IU] | Freq: Three times a day (TID) | INTRAMUSCULAR | Status: DC
  Administered 2024-02-06: 1 [IU] via SUBCUTANEOUS
  Filled 2024-02-04: qty 1

## 2024-02-04 MED ORDER — DEXTROSE 50 % IV SOLN
25.0000 mL | Freq: Once | INTRAVENOUS | Status: AC
  Administered 2024-02-04: 25 mL via INTRAVENOUS

## 2024-02-04 MED ORDER — MAGNESIUM HYDROXIDE 400 MG/5ML PO SUSP: 30.0000 mL | Freq: Every day | ORAL | Status: DC | PRN

## 2024-02-04 MED ORDER — DEXTROSE 50 % IV SOLN
INTRAVENOUS | Status: DC
Start: 2024-02-04 — End: 2024-02-04
  Filled 2024-02-04: qty 50

## 2024-02-04 MED ORDER — AMLODIPINE BESYLATE 10 MG PO TABS
10.0000 mg | ORAL_TABLET | Freq: Every day | ORAL | Status: DC
  Administered 2024-02-05: 10 mg via ORAL
  Filled 2024-02-04 (×2): qty 1

## 2024-02-04 MED ORDER — THIAMINE HCL 100 MG PO TABS
100.0000 mg | ORAL_TABLET | Freq: Every day | ORAL | Status: DC
  Administered 2024-02-05: 100 mg via ORAL
  Filled 2024-02-04 (×4): qty 1

## 2024-02-04 MED ORDER — ATORVASTATIN CALCIUM 20 MG PO TABS
10.0000 mg | ORAL_TABLET | Freq: Every day | ORAL | Status: DC
  Administered 2024-02-04 – 2024-02-05 (×2): 10 mg via ORAL
  Filled 2024-02-04 (×3): qty 1

## 2024-02-04 MED ORDER — AMLODIPINE BESYLATE 5 MG PO TABS
5.0000 mg | ORAL_TABLET | Freq: Once | ORAL | Status: AC
  Administered 2024-02-04: 5 mg via ORAL
  Filled 2024-02-04: qty 1

## 2024-02-04 MED ORDER — AMOXICILLIN-POT CLAVULANATE 875-125 MG PO TABS
1.0000 | ORAL_TABLET | Freq: Two times a day (BID) | ORAL | Status: DC
  Administered 2024-02-04 – 2024-02-05 (×3): 1 via ORAL
  Filled 2024-02-04 (×4): qty 1

## 2024-02-04 MED ORDER — DEXTROSE 5 % IV SOLN: Freq: Once | INTRAVENOUS | Status: AC

## 2024-02-04 MED ORDER — ENOXAPARIN SODIUM 40 MG/0.4ML IJ SOSY
40.0000 mg | PREFILLED_SYRINGE | INTRAMUSCULAR | Status: DC
  Administered 2024-02-05 – 2024-02-06 (×2): 40 mg via SUBCUTANEOUS
  Filled 2024-02-04 (×2): qty 0.4

## 2024-02-04 MED ORDER — ONDANSETRON HCL 4 MG PO TABS: 4.0000 mg | ORAL_TABLET | Freq: Four times a day (QID) | ORAL | Status: DC | PRN

## 2024-02-04 MED ORDER — ONDANSETRON HCL 4 MG/2ML IJ SOLN
4.0000 mg | Freq: Four times a day (QID) | INTRAMUSCULAR | Status: DC | PRN
  Administered 2024-02-05: 4 mg via INTRAVENOUS
  Filled 2024-02-04: qty 2

## 2024-02-04 MED ORDER — ACETAMINOPHEN 325 MG PO TABS
650.0000 mg | ORAL_TABLET | Freq: Four times a day (QID) | ORAL | Status: DC | PRN
  Filled 2024-02-04: qty 2

## 2024-02-04 MED ORDER — TRAZODONE HCL 50 MG PO TABS
25.0000 mg | ORAL_TABLET | Freq: Every evening | ORAL | Status: DC | PRN
Start: 2024-02-04 — End: 2024-02-06

## 2024-02-04 NOTE — ED Triage Notes (Signed)
 Per EMS, friend called out for patient unresponsive in floor. Upon arrival with EMS blood sugar 27; administered D10, fruit juice and BG 87; ate half a ham sandwich and BG 56. Adminsitered an additional 150 mL D10. Per friend, states patient is non compliant with medications.

## 2024-02-04 NOTE — ED Notes (Signed)
 BGL recheck at 50. Patient brought another 8oz of orange juice to continue drinking. [place], MD notified.

## 2024-02-04 NOTE — ED Provider Notes (Signed)
 [facility] Provider Note    Event Date/Time   First MD Initiated Contact with Patient 02/04/24 1831     (approximate)   History   Hypoglycemia   HPI  Micheal Wong is a 53 y.o. male with history of diabetes who takes metformin twice daily, Humalog and Lantus  who presents after an episode of hypoglycemia.  Patient reports he used his Humalog this morning after having coffee but ended up not eating lunch.  EMS reports that patient may have fallen and has had     Physical Exam   Triage Vital Signs: ED Triage Vitals  Encounter Vitals Group     BP 02/04/24 1833 (!) 196/114     Girls Systolic BP Percentile --      Girls Diastolic BP Percentile --      Boys Systolic BP Percentile --      Boys Diastolic BP Percentile --      Pulse Rate 02/04/24 1833 (!) 101     Resp 02/04/24 1833 20     Temp 02/04/24 1833 (!) 97.5 F (36.4 C)     Temp Source 02/04/24 1833 Oral     SpO2 02/04/24 1833 100 %     Weight 02/04/24 1834 86.2 kg (190 lb)     Height 02/04/24 1834 1.778 m (5' 10)     Head Circumference --      Peak Flow --      Pain Score 02/04/24 1908 8     Pain Loc --      Pain Education --      Exclude from Growth Chart --     Most recent vital signs: Vitals:   02/04/24 2000 02/04/24 2030  BP: (!) 167/96 (!) 171/109  Pulse: 94 93  Resp: 19 18  Temp:    SpO2: 90% 100%     General: Awake, no distress.  CV:  Good peripheral perfusion.  Resp:  Normal effort.  Abd:  No distention.  Other:  No signs of trauma   ED Results / Procedures / Treatments   Labs (all labs ordered are listed, but only abnormal results are displayed) Labs Reviewed  CBC - Abnormal; Notable for the following components:      Result Value   RBC 3.84 (*)    Hemoglobin 11.9 (*)    HCT 37.0 (*)    All other components within normal limits  COMPREHENSIVE METABOLIC PANEL WITH GFR - Abnormal; Notable for the following components:   Glucose, Bld 161 (*)    BUN 5 (*)     Calcium  8.1 (*)    Total Protein 6.0 (*)    Albumin 3.0 (*)    AST 230 (*)    ALT 112 (*)    Alkaline Phosphatase 176 (*)    All other components within normal limits  CBG MONITORING, ED - Abnormal; Notable for the following components:   Glucose-Capillary 142 (*)    All other components within normal limits  CBG MONITORING, ED - Abnormal; Notable for the following components:   Glucose-Capillary 50 (*)    All other components within normal limits  CBG MONITORING, ED - Abnormal; Notable for the following components:   Glucose-Capillary 42 (*)    All other components within normal limits  CBG MONITORING, ED - Abnormal; Notable for the following components:   Glucose-Capillary 59 (*)    All other components within normal limits  CBG MONITORING, ED  CBG MONITORING, ED     EKG  RADIOLOGY CT head and cervical spine without acute abnormality    PROCEDURES:  Critical Care performed: yes  CRITICAL CARE Performed by: Lamar Price   Total critical care time: 30 minutes  Critical care time was exclusive of separately billable procedures and treating other patients.  Critical care was necessary to treat or prevent imminent or life-threatening deterioration.  Critical care was time spent personally by me on the following activities: development of treatment plan with patient and/or surrogate as well as nursing, discussions with consultants, evaluation of patient's response to treatment, examination of patient, obtaining history from patient or surrogate, ordering and performing treatments and interventions, ordering and review of laboratory studies, ordering and review of radiographic studies, pulse oximetry and re-evaluation of patient's condition.   Procedures   MEDICATIONS ORDERED IN ED: Medications  dextrose  5 % solution (has no administration in time range)  ketorolac (TORADOL) 30 MG/ML injection 30 mg (30 mg Intravenous Given 02/04/24 1908)  dextrose  50 %  solution 25 mL ( Intravenous Canceled Entry 02/04/24 2018)     IMPRESSION / MDM / ASSESSMENT AND PLAN / ED COURSE  I reviewed the triage vital signs and the nursing notes. Patient's presentation is most consistent with acute presentation with potential threat to life or bodily function.  Patient presents after an episode of hypoglycemia, reportedly his glucose was 27, was given 100 mL of D10 and fruit juice, glucose has improved.  Is 150 here  He is alert and oriented, will obtain labs, obtain CT head cervical spine given reports of fall secondary to hypoglycemia.  CT imaging is reassuring.  However patient has continued to have multiple episodes of hypoglycemia despite eating juice and crackers and half an amp of D50  Given this I have ordered D5 drip and consulted with the hospitalist for admission      FINAL CLINICAL IMPRESSION(S) / ED DIAGNOSES   Final diagnoses:  Hypoglycemia     Rx / DC Orders   ED Discharge Orders     None        Note:  This document was prepared using Dragon voice recognition software and may include unintentional dictation errors.   Price Lamar, MD 02/04/24 2142

## 2024-02-04 NOTE — ED Notes (Signed)
 Patient provided with another 8oz of orange juice. Kinner,MD notified.

## 2024-02-04 NOTE — H&P (Signed)
 [place]   PATIENT NAME: Micheal Wong    MR#:  [id number]  DATE OF BIRTH:  14-Apr-1971  DATE OF ADMISSION:  02/04/2024  PRIMARY CARE PHYSICIAN: Dyane Carlyon Norris, FNP   Patient is coming from: Home  REQUESTING/REFERRING PHYSICIAN: Marolyn Charleston, MD  CHIEF COMPLAINT:   Chief Complaint  Patient presents with   Hypoglycemia    HISTORY OF PRESENT ILLNESS:  Micheal Wong is a 53 y.o. male with medical history significant for diabetes mellitus, hypertension and pancreatitis, presented to the emergency room with acute onset of hypoglycemia with associated unresponsiveness.  The patient took his metformin this morning and has been taking his Humalog and Lantus  regularly.  He had coffee this morning and missed lunch.  No chest pain or palpitations.  No reported seizures or focal muscle weakness.  No cough or wheezing or dyspnea.  No dysuria polyuria or hematuria flank pain.  No nausea or vomiting or abdominal pain.  He admits to diarrhea with loose and watery bowel movements.  He is complaining of anal cyst which has been tender.  He believes when he fell when he had syncope and denied paresthesias or focal muscle weakness or head injuries.  No fever or chills.    ED Course: When right side pain with signs mild reversal of normal 5 mL doses and mild multilevel generative changes without acute fracture or subluxation.  Came to the ER, BP was 196/114 with tension 97.5 and heart rate 101 with otherwise normal vital signs.  Labs revealed a potassium of 3.5 and glucose of 161 however fingerstick blood glucose level dropped to 42 281 then narrowed down to 59.  Alk phos was 176 and albumin 3 with total protein of 6, AST 230 and ALT 112.  CBC showed anemia with hemoglobin 11.9 hematocrit 37 lower than previous level EKG as reviewed by me : None Imaging: Noncontrast head CT scan revealed the following: 1. No acute intracranial abnormality. 2. Moderate severity right-sided sphenoid sinus  disease. Neck CT showed the following: 1. Mild reversal of the normal cervical spine lordosis, which may be due to positioning or muscle spasm. 2. Mild multilevel degenerative changes without evidence of an acute fracture or subluxation.  The patient was given 30 g of IV Toradol, and after D50 and was later placed on D5W at 100 mL/h.  He will be admitted to an observation medical telemetry bed for further evaluation and management. PAST MEDICAL HISTORY:   Past Medical History:  Diagnosis Date   Diabetes mellitus without complication (HCC)    Hypertension    Pancreatitis     PAST SURGICAL HISTORY:  History reviewed. No pertinent surgical history.  SOCIAL HISTORY:   Social History   Tobacco Use   Smoking status: Every Day    Current packs/day: 0.50    Types: Cigarettes   Smokeless tobacco: Never  Substance Use Topics   Alcohol use: Yes    Comment: occasionally    FAMILY HISTORY:  History reviewed. No pertinent family history.  DRUG ALLERGIES:   Allergies  Allergen Reactions   Drug Class [Ace Inhibitors] Swelling    Swelling    REVIEW OF SYSTEMS:   ROS As per history of present illness. All pertinent systems were reviewed above. Constitutional, HEENT, cardiovascular, respiratory, GI, GU, musculoskeletal, neuro, psychiatric, endocrine, integumentary and hematologic systems were reviewed and are otherwise negative/unremarkable except for positive findings mentioned above in the HPI.   MEDICATIONS AT HOME:   Prior to Admission medications  Medication Sig Start Date End Date Taking? Authorizing Provider  amLODipine  (NORVASC ) 10 MG tablet Take 10 mg by mouth daily.    [provider]  atorvastatin  (LIPITOR) 10 MG tablet Take 1 tablet (10 mg total) by mouth daily. Does not know dose 02/29/20   Josette Ade, MD  fenofibrate  (TRICOR ) 48 MG tablet Take 48 mg by mouth daily. Does not know dose    [provider]  insulin  glargine (LANTUS ) 100 UNIT/ML  injection Inject 30 Units into the skin daily.    [provider]  metFORMIN (GLUCOPHAGE) 500 MG tablet Take 500 mg by mouth daily with breakfast. Not sure of the dose    [provider]  mupirocin  ointment (BACTROBAN ) 2 % Apply 1 Application topically 2 (two) times daily. 03/11/23   Fisher, Devere ORN, PA-C  oxyCODONE  (OXY IR/ROXICODONE ) 5 MG immediate release tablet Take 1 tablet (5 mg total) by mouth every 6 (six) hours as needed for severe pain. 02/29/20   Josette Ade, MD  thiamine  100 MG tablet Take 1 tablet (100 mg total) by mouth daily. 03/01/20   Josette Ade, MD  triamcinolone  ointment (KENALOG ) 0.5 % Apply 1 Application topically 2 (two) times daily. 03/11/23   Fisher, Devere ORN, PA-C      VITAL SIGNS:  Blood pressure (!) 168/97, pulse 92, temperature 98.9 F (37.2 C), temperature source Oral, resp. rate 20, height 5' 10.5 (1.791 m), weight 85.8 kg, SpO2 100%.  PHYSICAL EXAMINATION:  Physical Exam  GENERAL:  53 y.o.-year-old patient lying in the bed with no acute distress.  EYES: Pupils equal, round, reactive to light and accommodation. No scleral icterus. Extraocular muscles intact.  HEENT: Head atraumatic, normocephalic. Oropharynx and nasopharynx clear.  NECK:  Supple, no jugular venous distention. No thyroid enlargement, no tenderness.  LUNGS: Normal breath sounds bilaterally, no wheezing, rales,rhonchi or crepitation. No use of accessory muscles of respiration.  CARDIOVASCULAR: Regular rate and rhythm, S1, S2 normal. No murmurs, rubs, or gallops.  ABDOMEN: Soft, nondistended, nontender. Bowel sounds present. No organomegaly or mass.  EXTREMITIES: No pedal edema, cyanosis, or clubbing.  NEUROLOGIC: Cranial nerves II through XII are intact. Muscle strength 5/5 in all extremities. Sensation intact. Gait not checked.  PSYCHIATRIC: The patient is alert and oriented x 3.  Normal affect and good eye contact. SKIN: Tender warm anal cyst without fluctuations or  drainage.   LABORATORY PANEL:   CBC Recent Labs  Lab 02/04/24 1831  WBC 7.1  HGB 11.9*  HCT 37.0*  PLT 157   ------------------------------------------------------------------------------------------------------------------  Chemistries  Recent Labs  Lab 02/04/24 1831  NA 138  K 3.5  CL 104  CO2 23  GLUCOSE 161*  BUN 5*  CREATININE 0.80  CALCIUM  8.1*  AST 230*  ALT 112*  ALKPHOS 176*  BILITOT 0.6   ------------------------------------------------------------------------------------------------------------------  Cardiac Enzymes No results for input(s): TROPONINI in the last 168 hours. ------------------------------------------------------------------------------------------------------------------  RADIOLOGY:  CT Cervical Spine Wo Contrast Result Date: 02/04/2024 CLINICAL DATA:  Patient found unresponsive. EXAM: CT CERVICAL SPINE WITHOUT CONTRAST TECHNIQUE: Multidetector CT imaging of the cervical spine was performed without intravenous contrast. Multiplanar CT image reconstructions were also generated. RADIATION DOSE REDUCTION: This exam was performed according to the departmental dose-optimization program which includes automated exposure control, adjustment of the mA and/or kV according to patient size and/or use of iterative reconstruction technique. COMPARISON:  None Available. FINDINGS: Alignment: There is mild reversal of the normal cervical spine lordosis. Skull base and vertebrae: No acute fracture. No primary bone lesion  or focal pathologic process. Soft tissues and spinal canal: No prevertebral fluid or swelling. No visible canal hematoma. Disc levels: Mild anterior osteophyte formation is seen at the levels of C4-C5, C5-C6 and C6-C7. Mild multilevel intervertebral disc space narrowing is seen throughout the cervical spine. Mild, bilateral multilevel facet joint hypertrophy is noted. Upper chest: Negative. Other: None. IMPRESSION: 1. Mild reversal of the normal  cervical spine lordosis, which may be due to positioning or muscle spasm. 2. Mild multilevel degenerative changes without evidence of an acute fracture or subluxation. Electronically Signed   By: Suzen Dials M.D.   On: 02/04/2024 20:10   CT Head Wo Contrast Result Date: 02/04/2024 CLINICAL DATA:  Patient found unresponsive. EXAM: CT HEAD WITHOUT CONTRAST TECHNIQUE: Contiguous axial images were obtained from the base of the skull through the vertex without intravenous contrast. RADIATION DOSE REDUCTION: This exam was performed according to the departmental dose-optimization program which includes automated exposure control, adjustment of the mA and/or kV according to patient size and/or use of iterative reconstruction technique. COMPARISON:  None Available. FINDINGS: Brain: No evidence of acute infarction, hemorrhage, hydrocephalus, extra-axial collection or mass lesion/mass effect. Vascular: Moderate severity bilateral cavernous carotid artery calcification is noted. Skull: Normal. Negative for fracture or focal lesion. Sinuses/Orbits: There is moderate severity right-sided sphenoid sinus mucosal thickening. Other: None. IMPRESSION: 1. No acute intracranial abnormality. 2. Moderate severity right-sided sphenoid sinus disease. Electronically Signed   By: Suzen Dials M.D.   On: 02/04/2024 20:06      IMPRESSION AND PLAN:  Assessment and Plan: * Hypoglycemia - This is associated with type 2 diabetes mellitus. -The patient be admitted to a medical telemetry observation bed for - Will continue on D5W at 100 mL/h. - Will hold off his Lantus  as well as metformin. - Will place on subcu with coverage with NovoLog   Type 2 diabetes mellitus with peripheral neuropathy (HCC) - She will be placed on supplemental coverage with NovoLog . - His Lantus  will be held off for 24 hours as mentioned above given his hypoglycemia. - Will continue Neurontin and hold off Elavil.  Anal lesion - This seems to be  associated with early infection. - Will place the patient on p.o. Augmentin. - General surgery consult will be obtained. - Dr. Cesar was notified about the patient.  Essential hypertension We will continue antihypertensive therapy.  Dyslipidemia - Will continue statin therapy and fenofibrate .    DVT prophylaxis: Lovenox .  Advanced Care Planning:  Code Status: full code.  Family Communication:  The plan of care was discussed in details with the patient (and family). I answered all questions. The patient agreed to proceed with the above mentioned plan. Further management will depend upon hospital course. Disposition Plan: Back to previous home environment Consults called: none.  All the records are reviewed and case discussed with ED provider.  Status is: Observation  I certify that at the time of admission, it is my clinical judgment that the patient will require hospital care extending less than 2 midnights.                            Dispo: The patient is from: Home              Anticipated d/c is to: Home              Patient currently is not medically stable to d/c.  Difficult to place patient: No  [place] DELENA Peaches M.D on 02/05/2024 at 12:29 AM  [facility]   From 7 PM-7 AM, contact night-coverage [email or web address]  CC: Primary care physician; Dyane Carlyon Norris, FNP

## 2024-02-04 NOTE — ED Notes (Signed)
 BGL 82. Patient provided with peanut butter crackers and orange juice due to significant drop in BGL within 30 minutes. Will continue to recheck to ensure patient is not getting too low again.

## 2024-02-04 NOTE — ED Notes (Signed)
 Report received from [place] RN and assumed care of patient at this time. Patient is sitting in ER stretcher, no acute distress, CCM in use, call light within reach. Patient currently complaints of head and neck pain. [name], MD.

## 2024-02-04 NOTE — Assessment & Plan Note (Signed)
-   Will continue statin therapy and fenofibrate .

## 2024-02-04 NOTE — Assessment & Plan Note (Addendum)
-   We will continue antihypertensive therapy.

## 2024-02-04 NOTE — Assessment & Plan Note (Signed)
-   She will be placed on supplemental coverage with NovoLog . - His Lantus  will be held off for 24 hours as mentioned above given his hypoglycemia. - Will continue Neurontin and hold off Elavil.

## 2024-02-04 NOTE — ED Notes (Signed)
 Patient given 25G dextrose  after BGL of 42. [place] sandwich also provided after dietary stocked the floor at request. VSS, CCM in use, call light within reach. Family at bedside.

## 2024-02-04 NOTE — Assessment & Plan Note (Addendum)
-   This is associated with type 2 diabetes mellitus. -The patient be admitted to a medical telemetry observation bed for - Will continue on D5W at 100 mL/h. - Will hold off his Lantus  as well as metformin. - Will place on subcu with coverage with NovoLog

## 2024-02-05 DIAGNOSIS — K629 Disease of anus and rectum, unspecified: Secondary | ICD-10-CM | POA: Diagnosis not present

## 2024-02-05 DIAGNOSIS — I10 Essential (primary) hypertension: Secondary | ICD-10-CM | POA: Diagnosis not present

## 2024-02-05 DIAGNOSIS — E1142 Type 2 diabetes mellitus with diabetic polyneuropathy: Secondary | ICD-10-CM | POA: Diagnosis not present

## 2024-02-05 DIAGNOSIS — E162 Hypoglycemia, unspecified: Secondary | ICD-10-CM | POA: Diagnosis not present

## 2024-02-05 LAB — GLUCOSE, CAPILLARY
Glucose-Capillary: 157 mg/dL — ABNORMAL HIGH (ref 70–99)
Glucose-Capillary: 72 mg/dL (ref 70–99)
Glucose-Capillary: 89 mg/dL (ref 70–99)
Glucose-Capillary: 113 mg/dL — ABNORMAL HIGH (ref 70–99)
Glucose-Capillary: 78 mg/dL (ref 70–99)
Glucose-Capillary: 90 mg/dL (ref 70–99)
Glucose-Capillary: 112 mg/dL — ABNORMAL HIGH (ref 70–99)

## 2024-02-05 LAB — BASIC METABOLIC PANEL WITH GFR
Anion gap: 10 (ref 5–15)
BUN: <5 mg/dL — ABNORMAL LOW (ref 6–20)
CO2: 23 mmol/L (ref 22–32)
Calcium: 7.6 mg/dL — ABNORMAL LOW (ref 8.9–10.3)
Chloride: 105 mmol/L (ref 98–111)
Creatinine, Ser: 0.56 mg/dL — ABNORMAL LOW (ref 0.61–1.24)
GFR, Estimated: >60 mL/min (ref >60)
Glucose, Bld: 68 mg/dL — ABNORMAL LOW (ref 70–99)
Potassium: 3.5 mmol/L (ref 3.5–5.1)
Sodium: 138 mmol/L (ref 135–145)

## 2024-02-05 LAB — CBC
HCT: 31.6 % — ABNORMAL LOW (ref 39.0–52.0)
Hemoglobin: 10.6 g/dL — ABNORMAL LOW (ref 13.0–17.0)
MCH: 30.7 pg (ref 26.0–34.0)
MCHC: 33.5 g/dL (ref 30.0–36.0)
MCV: 91.6 fL (ref 80.0–100.0)
Platelets: 140 10*3/uL — ABNORMAL LOW (ref 150–400)
RBC: 3.45 MIL/uL — ABNORMAL LOW (ref 4.22–5.81)
RDW: 13.2 % (ref 11.5–15.5)
WBC: 5.4 10*3/uL (ref 4.0–10.5)
nRBC: 0 % (ref 0.0–0.2)

## 2024-02-05 LAB — HEMOGLOBIN A1C
Hgb A1c MFr Bld: 9.3 % — ABNORMAL HIGH (ref 4.8–5.6)
Mean Plasma Glucose: 220.21 mg/dL

## 2024-02-05 LAB — HIV ANTIBODY (ROUTINE TESTING W REFLEX): HIV Screen 4th Generation wRfx: NONREACTIVE

## 2024-02-05 MED ORDER — ASPIRIN 81 MG PO CHEW
81.0000 mg | CHEWABLE_TABLET | Freq: Every day | ORAL | Status: DC
  Administered 2024-02-05: 81 mg via ORAL
  Filled 2024-02-05 (×2): qty 1

## 2024-02-05 MED ORDER — DEXTROSE 5 % IV SOLN: Freq: Once | INTRAVENOUS | Status: AC

## 2024-02-05 MED ORDER — ENSURE PLUS HIGH PROTEIN PO LIQD
237.0000 mL | Freq: Two times a day (BID) | ORAL | Status: DC
  Administered 2024-02-05: 237 mL via ORAL

## 2024-02-05 MED ORDER — LOSARTAN POTASSIUM 50 MG PO TABS
50.0000 mg | ORAL_TABLET | Freq: Every day | ORAL | Status: DC
  Administered 2024-02-05: 50 mg via ORAL
  Filled 2024-02-05 (×2): qty 1

## 2024-02-05 MED ORDER — MORPHINE SULFATE (PF) 2 MG/ML IV SOLN
2.0000 mg | INTRAVENOUS | Status: DC | PRN
  Administered 2024-02-05 – 2024-02-06 (×5): 2 mg via INTRAVENOUS
  Filled 2024-02-05 (×5): qty 1

## 2024-02-05 MED ORDER — METHOCARBAMOL 500 MG PO TABS
750.0000 mg | ORAL_TABLET | Freq: Four times a day (QID) | ORAL | Status: DC | PRN
  Administered 2024-02-05: 750 mg via ORAL
  Filled 2024-02-05: qty 2

## 2024-02-05 MED ORDER — DEXTROSE 50 % IV SOLN
1.0000 | Freq: Once | INTRAVENOUS | Status: AC
  Administered 2024-02-05: 50 mL via INTRAVENOUS
  Filled 2024-02-05: qty 50

## 2024-02-05 MED ORDER — AMITRIPTYLINE HCL 25 MG PO TABS
75.0000 mg | ORAL_TABLET | Freq: Every day | ORAL | Status: DC
  Administered 2024-02-05: 75 mg via ORAL
  Filled 2024-02-05: qty 3

## 2024-02-05 MED ORDER — GABAPENTIN 400 MG PO CAPS
800.0000 mg | ORAL_CAPSULE | Freq: Three times a day (TID) | ORAL | Status: DC
  Administered 2024-02-05 (×2): 800 mg via ORAL
  Filled 2024-02-05: qty 8
  Filled 2024-02-05: qty 2
  Filled 2024-02-05: qty 8
  Filled 2024-02-05 (×3): qty 2
  Filled 2024-02-05: qty 8

## 2024-02-05 NOTE — Progress Notes (Addendum)
 PROGRESS NOTE    Micheal Wong  FMW:[id number] DOB: 08-22-1970 DOA: 02/04/2024 PCP: Dyane Carlyon Norris, FNP  Chief Complaint  Patient presents with   Hypoglycemia    Hospital Course:  Alfard Cochrane Is a 53 year old with diabetes, hypertension, prior pancreatitis, recent rib fractures (8, 9, 10, 11 right-sided after fall at home 11/04/2023), heart failure reduced EF 40%, hyperlipidemia, history of syncope, who presented to the ED with acute onset hypoglycemia and associated unresponsiveness.  Patient reports he took his home dose insulin , 15 units Humalog, and then did not eat.  In the ED blood pressure was 196/115, heart rate 101.  Initial blood glucose 161 but later dropped to 42.  Patient was admitted for further management. Noncon head CT without acute abnormality, neck CT with degenerative changes.  Subjective: Patient reports he is tired today.  Reports he still feels more fatigued than baseline.   Objective: Vitals:   02/04/24 2253 02/05/24 0432 02/05/24 0832 02/05/24 1146  BP:  (!) 125/47 137/84 (!) 154/103  Pulse:  86 85 87  Resp:  18 16 16   Temp:  98.1 F (36.7 C) 98 F (36.7 C) 98 F (36.7 C)  TempSrc:      SpO2:  100% 100% 100%  Weight: 85.8 kg     Height: 5' 10.5 (1.791 m)       Intake/Output Summary (Last 24 hours) at 02/05/2024 1622 Last data filed at 02/05/2024 1539 Gross per 24 hour  Intake 477.71 ml  Output --  Net 477.71 ml   Filed Weights   02/04/24 1834 02/04/24 2253  Weight: 86.2 kg 85.8 kg    Examination: General exam: Appears calm and comfortable, NAD  Respiratory system: No work of breathing, symmetric chest wall expansion Cardiovascular system: S1 & S2 heard, RRR.  Gastrointestinal system: Abdomen is nondistended, soft and nontender.  Neuro: Alert and oriented. No focal neurological deficits. Extremities: Symmetric, expected ROM Skin: No rashes, lesions Psychiatry: Demonstrates appropriate judgement and insight. Mood & affect appropriate  for situation. GU: Rectal exam completed with chaperone, Ronal, at bedside.  Small 1 cm anal lesion at 12:00, no active purulence or bleeding appreciated  Assessment & Plan:  Principal Problem:   Hypoglycemia Active Problems:   Type 2 diabetes mellitus with peripheral neuropathy (HCC)   Essential hypertension   Anal lesion   Dyslipidemia   Hypoglycemia - Secondary to insulin  without food - Received D5 - Continue to monitor CBGs closely.  Blood glucoses appear lower than expected given patient's report of 15 units Humalog yesterday. - Hold long-acting insulin  for now until stabilized - Will obtain PT eval to ensure safety at home  Uncontrolled type 2 diabetes - Hemoglobin A1c 9.3% - Have consulted diabetes educator to provide nutritional guidance to patient - Has had difficulty with medication adherence in the past  Normocytic anemia - May be hemodilutional - Will repeat CBC in a.m. - No acute bleeding.  Hemoglobin stable  Heart failure reduced EF - Resume home meds - Clinically euvolemic at this time  Hyperlipidemia - Cont home Statin, fenofibrate   Anal lesion - Patient has small possible pilonidal cyst, difficult to truly evaluate - On Augmentin - Patient denies any pain at this time.  Reports that he believes it spontaneously drained this AM. - General Surgery consulted by admitting MD, doubt surgical intervention necessary at this time - No leukocytosis or fever to suggest systemic infection  Hypertension - Resume home meds  Chronic pain - On gabapentin, will continue at home dose  DVT  prophylaxis: lovenox    Code Status: Full Code Disposition:  Inpatient, pending resolution in BG  Consultants:    Procedures:    Antimicrobials:  Anti-infectives (From admission, onward)    Start     Dose/Rate Route Frequency Ordered Stop   02/04/24 2315  amoxicillin-clavulanate (AUGMENTIN) 875-125 MG per tablet 1 tablet        1 tablet Oral Every 12 hours 02/04/24 2228          Data Reviewed: I have personally reviewed following labs and imaging studies CBC: Recent Labs  Lab 02/04/24 1831 02/05/24 0500  WBC 7.1 5.4  HGB 11.9* 10.6*  HCT 37.0* 31.6*  MCV 96.4 91.6  PLT 157 140*   Basic Metabolic Panel: Recent Labs  Lab 02/04/24 1831 02/05/24 0500  NA 138 138  K 3.5 3.5  CL 104 105  CO2 23 23  GLUCOSE 161* 68*  BUN 5* <5*  CREATININE 0.80 0.56*  CALCIUM  8.1* 7.6*   GFR: Estimated Creatinine Clearance: 113.4 mL/min (A) (by C-G formula based on SCr of 0.56 mg/dL (L)). Liver Function Tests: Recent Labs  Lab 02/04/24 1831  AST 230*  ALT 112*  ALKPHOS 176*  BILITOT 0.6  PROT 6.0*  ALBUMIN 3.0*   CBG: Recent Labs  Lab 02/04/24 2311 02/05/24 0103 02/05/24 0545 02/05/24 0835 02/05/24 1145  GLUCAP 69* 157* 72 89 113*    No results found for this or any previous visit (from the past 240 hours).   Radiology Studies: CT Cervical Spine Wo Contrast Result Date: 02/04/2024 CLINICAL DATA:  Patient found unresponsive. EXAM: CT CERVICAL SPINE WITHOUT CONTRAST TECHNIQUE: Multidetector CT imaging of the cervical spine was performed without intravenous contrast. Multiplanar CT image reconstructions were also generated. RADIATION DOSE REDUCTION: This exam was performed according to the departmental dose-optimization program which includes automated exposure control, adjustment of the mA and/or kV according to patient size and/or use of iterative reconstruction technique. COMPARISON:  None Available. FINDINGS: Alignment: There is mild reversal of the normal cervical spine lordosis. Skull base and vertebrae: No acute fracture. No primary bone lesion or focal pathologic process. Soft tissues and spinal canal: No prevertebral fluid or swelling. No visible canal hematoma. Disc levels: Mild anterior osteophyte formation is seen at the levels of C4-C5, C5-C6 and C6-C7. Mild multilevel intervertebral disc space narrowing is seen throughout the cervical  spine. Mild, bilateral multilevel facet joint hypertrophy is noted. Upper chest: Negative. Other: None. IMPRESSION: 1. Mild reversal of the normal cervical spine lordosis, which may be due to positioning or muscle spasm. 2. Mild multilevel degenerative changes without evidence of an acute fracture or subluxation. Electronically Signed   By: Suzen Dials M.D.   On: 02/04/2024 20:10   CT Head Wo Contrast Result Date: 02/04/2024 CLINICAL DATA:  Patient found unresponsive. EXAM: CT HEAD WITHOUT CONTRAST TECHNIQUE: Contiguous axial images were obtained from the base of the skull through the vertex without intravenous contrast. RADIATION DOSE REDUCTION: This exam was performed according to the departmental dose-optimization program which includes automated exposure control, adjustment of the mA and/or kV according to patient size and/or use of iterative reconstruction technique. COMPARISON:  None Available. FINDINGS: Brain: No evidence of acute infarction, hemorrhage, hydrocephalus, extra-axial collection or mass lesion/mass effect. Vascular: Moderate severity bilateral cavernous carotid artery calcification is noted. Skull: Normal. Negative for fracture or focal lesion. Sinuses/Orbits: There is moderate severity right-sided sphenoid sinus mucosal thickening. Other: None. IMPRESSION: 1. No acute intracranial abnormality. 2. Moderate severity right-sided sphenoid sinus disease. Electronically Signed  By: Suzen Dials M.D.   On: 02/04/2024 20:06    Scheduled Meds:  amLODipine   10 mg Oral Daily   amoxicillin-clavulanate  1 tablet Oral Q12H   atorvastatin   10 mg Oral Daily   enoxaparin  (LOVENOX ) injection  40 mg Subcutaneous Q24H   feeding supplement  237 mL Oral BID BM   fenofibrate   54 mg Oral Daily   insulin  aspart  0-5 Units Subcutaneous QHS   insulin  aspart  0-9 Units Subcutaneous TID WC   thiamine   100 mg Oral Daily   Continuous Infusions:   LOS: 0 days  MDM: Patient is high risk for one  or more organ failure.  They necessitate ongoing hospitalization for continued IV therapies and subsequent lab monitoring. Total time spent interpreting labs and vitals, reviewing the medical record, coordinating care amongst consultants and care team members, directly assessing and discussing care with the patient and/or family: 55 min  Sayf Kerner, DO [facility]  To contact the attending physician between 7A-7P please use Epic Chat. To contact the covering physician during after hours 7P-7A, please review Amion.  02/05/2024, 4:22 PM   *This document has been created with the assistance of dictation software. Please excuse typographical errors. *

## 2024-02-05 NOTE — Plan of Care (Signed)

## 2024-02-05 NOTE — TOC Initial Note (Signed)
 Transition of Care [facility]) - Initial/Assessment Note    Patient Details  Name: Micheal Wong MRN: [id number] Date of Birth: 1970/10/14  Transition of Care [facility]) CM/SW Contact:    Dalia GORMAN Fuse, RN Phone Number: 02/05/2024, 11:48 AM  Clinical Narrative:                  No TOC needs at this time, please outreach to [facility] if needs are identified.       Patient Goals and CMS Choice            Expected Discharge Plan and Services                                              Prior Living Arrangements/Services                       Activities of Daily Living   ADL Screening (condition at time of admission) Independently performs ADLs?: Yes (appropriate for developmental age) Is the patient deaf or have difficulty hearing?: No Does the patient have difficulty seeing, even when wearing glasses/contacts?: No Does the patient have difficulty concentrating, remembering, or making decisions?: No  Permission Sought/Granted                  Emotional Assessment              Admission diagnosis:  Hypoglycemia [E16.2] Patient Active Problem List   Diagnosis Date Noted   Anal lesion 02/05/2024   Hypoglycemia 02/04/2024   Dyslipidemia 02/04/2024   Type 2 diabetes mellitus with peripheral neuropathy (HCC) 02/04/2024   Hyperglycemia    Atrial fibrillation with rapid ventricular response (HCC)    Acute alcoholic pancreatitis 10/26/2020   Essential hypertension    Pancreatitis 02/27/2020   Type 2 diabetes mellitus with hyperlipidemia (HCC) 02/27/2020   Hyperlipemia 02/27/2020   Dehydration 02/27/2020   Pancreatitis, recurrent 02/27/2020   ETOH abuse 02/27/2020   Tobacco abuse 02/27/2020   Hypokalemia 02/27/2020   Hyponatremia 02/27/2020   PCP:  Dyane Carlyon Norris, FNP Pharmacy:   [id number] [place], [place] - [address] AT [facility] OF [address] [place] [address] Phone:  [phone] Fax: [phone]  [facility] [address]), [place] - [address] [place]) [place] [address] Phone: [phone] Fax: [phone]     Social Drivers of Health (SDOH) Social History: SDOH Screenings   Food Insecurity: No Food Insecurity (02/04/2024)  Housing: Low Risk  (02/04/2024)  Transportation Needs: No Transportation Needs (02/04/2024)  Utilities: Not At Risk (02/04/2024)  Financial Resource Strain: Medium Risk (08/03/2023)   Received from [facility]  Physical Activity: Inactive (08/03/2023)   Received from [facility]  Social Connections: Unknown (02/04/2024)  Stress: Stress Concern Present (08/03/2023)   Received from [facility]  Tobacco Use: High Risk (02/04/2024)  Health Literacy: Adequate Health Literacy (08/03/2023)   Received from [facility]   SDOH Interventions:     Readmission Risk Interventions     No data to display

## 2024-02-05 NOTE — Assessment & Plan Note (Signed)
-   This seems to be associated with early infection. - Will place the patient on p.o. Augmentin. - General surgery consult will be obtained. - Dr. Cesar was notified about the patient.

## 2024-02-05 NOTE — Inpatient Diabetes Management (Addendum)
 Inpatient Diabetes Program Recommendations  AACE/ADA: New Consensus Statement on Inpatient Glycemic Control (2015)  Target Ranges:  Prepandial:   less than 140 mg/dL      Peak postprandial:   less than 180 mg/dL (1-2 hours)      Critically ill patients:  140 - 180 mg/dL    Latest Reference Range & Units 02/04/24 18:31  Hemoglobin A1C 4.8 - 5.6 % 9.3 (H)  220 mg/dl  (H): Data is abnormally high  Latest Reference Range & Units 02/04/24 18:30 02/04/24 19:07 02/04/24 19:34 02/04/24 20:04 02/04/24 20:31 02/04/24 21:09 02/04/24 22:05  Glucose-Capillary 70 - 99 mg/dL 857 (H) 83 50 (L) 42 (LL) 81 59 (L) 80    Latest Reference Range & Units 02/04/24 23:11 02/05/24 01:03 02/05/24 05:45 02/05/24 08:35  Glucose-Capillary 70 - 99 mg/dL 69 (L) 842 (H) 72 89   Admit Hypoglycemia  History: DM, Pancreatitis  Home DM Meds: Lantus  30 units daily        Humalog 15 units TID          Jardiance 25 mg daily        Januvia 100 mg daily     Metformin 500 mg daily  Current Orders: Novolog  0-9 units TID AC + HS   Current A1c= 9.3% (Last A1c on file was 7.7% at PCP visit Feb 2025) PCP: [facility]) Last seen for DM Feb 2025 Was told to take the following: Metformin 2000 mg daily  Increase Lantus  to 30 units Daily Continue Humalog 12 units TID  Stop Jardiance (looks like they had pt restart Jardiance at June 2025 appt)   Addendum 11am- Met w/ pt at bedside this AM to discuss home meds and events leading to admission.  Pt confirmed with me that he is taking Lantus  30 units daily + Humalog 12 units TID with meals (dose recently reduced from 15 units TID due to Metformin dose being increased) + Metformin 1000 mg BID.  Pt told me he has NOT been taking the Jardiance nor the Januvia due to high cost and his insurance not covering these meds.  Pt has a traditional fingerstick CBG meter at home and checks TID AC.  He is currently not interested in CGM and is OK using his home glucometer  for now.  We reviewed his current A1c that is up from Feb (currently 9.3%--was 7.7%).  Pt thinks that possibly stopping the oral meds Januvia and Jardiance may have contributed to his higher A1c this time.  We talked about the importance of better glucose control to prevent long term comps.  Reviewed healthy CBG goals for home.  I asked pt what he thought led to his admission with HYPO--pt told me he was not able to eat much prior to admission and was still taking his insulin .  I discussed with pt that taking the Lantus  should be OK if not able to eat well (give it is a basal insulin ), however, I cautioned pt about the Humalog if he is not eating.  We discussed how Humalog works and peaks and that he should be eating a full meal to take that 12 units of Humalog.  Discussed with pt that if his CBG is <150 and he not able to eat, he should likely not take the 12 units of Humalog, however, if his CBG is 200+ and he os not able to eat, he should likely take 1/3 of the full dose to get his CBG back to normal range (3-4 units).  I explained that Humalog is used to cover both food and bring high CBGs down.  Pt stated understanding and was very appreciative of visit.  Pt told me he had an follow up appt with PCP today--I have called pt's PCP office on his behalf and told PCP office pt is currently admitted to [facility].  Pt to call PCP office after d/c to re-schedule.      --Will follow patient during hospitalization--  Adina Rudolpho Arrow RN, MSN, CDCES Diabetes Coordinator Inpatient Glycemic Control Team Team Pager: [phone] (8a-5p)

## 2024-02-06 DIAGNOSIS — I10 Essential (primary) hypertension: Secondary | ICD-10-CM | POA: Diagnosis not present

## 2024-02-06 DIAGNOSIS — E162 Hypoglycemia, unspecified: Secondary | ICD-10-CM | POA: Diagnosis not present

## 2024-02-06 DIAGNOSIS — K629 Disease of anus and rectum, unspecified: Secondary | ICD-10-CM | POA: Diagnosis not present

## 2024-02-06 DIAGNOSIS — E1142 Type 2 diabetes mellitus with diabetic polyneuropathy: Secondary | ICD-10-CM | POA: Diagnosis not present

## 2024-02-06 LAB — GLUCOSE, CAPILLARY: Glucose-Capillary: 145 mg/dL — ABNORMAL HIGH (ref 70–99)

## 2024-02-06 MED ORDER — AMOXICILLIN-POT CLAVULANATE 875-125 MG PO TABS
1.0000 | ORAL_TABLET | Freq: Two times a day (BID) | ORAL | 0 refills | Status: AC
Start: 2024-02-06 — End: 2024-02-11

## 2024-02-06 NOTE — Discharge Summary (Signed)
 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  Patient reports he took his home dose insulin , 15 units Humalog, and then did not eat.  In the ED blood pressure was 196/115, heart rate 101.  Initial blood glucose 161 but later dropped to 42.  Patient was admitted for further management.  Noncon head CT without acute abnormality, neck CT with degenerative changes. Patient was monitored in house.  Gradually CBGs resolved.  Hemoglobin A1c was found to be elevated.  I discussed CBG monitoring with the patient and offered diabetes education but he refused.  I reiterated the importance of following up closely with his PCP and he endorses that he will.  I also reiterated the importance of holding insulin  if he is not going to eat.  He endorses understanding. Stay was complicated by rib pain.  Patient has fractured multiple ribs over 3 months ago.  He reports he is chronically taking oxycodone .  PDMP and refill history does not support that.  We have resumed his home dose gabapentin. On 7/1 patient reported he would like to be discharged and refused any further counseling or education on this topic.  Hypoglycemia - Secondary to insulin  without food - Received D5 - Continue to monitor CBGs closely.  Blood glucoses appear lower than expected given patient's report of 15 units Humalog yesterday. - Diabetes educator consulted, patient has refused  education  Uncontrolled type 2 diabetes - Hemoglobin A1c 9.3% - Have consulted diabetes educator to provide nutritional guidance to patient, patient refused - Has had difficulty with medication adherence in the past.  Advised on appropriate insulin  use.  Patient refuses further education on this topic. He would like to cont all home meds. Will need close outpt follow up with PCP.   Normocytic anemia - May be hemodilutional - No acute bleeding.  Hemoglobin stable --PCP follow up   Heart failure reduced EF - Resume home meds - Clinically euvolemic at this time   Hyperlipidemia - Cont home Statin, fenofibrate    Anal lesion - Patient has small possible pilonidal cyst, difficult to truly evaluate - On Augmentin x5 days - Patient denies any pain at this time.  Reports that he believes it spontaneously drained - No leukocytosis or fever to suggest systemic infection   Hypertension - Resume home meds   Chronic pain 2/2 to fractured ribs three months ago - On gabapentin, will continue at home dose -- Not on chronic opioid therapy. PDMP reviewed  Discharge Instructions  Discharge Instructions     Call MD for:  difficulty breathing, headache or visual disturbances   Complete by: As directed    Call MD for:  persistant dizziness or light-headedness   Complete by: As directed    Call MD for:  persistant nausea and vomiting   Complete by: As directed    Call MD for:  severe uncontrolled pain   Complete by: As directed    Call MD for:  temperature >100.4   Complete by: As directed    Diet general  Complete by: As directed    Discharge instructions   Complete by: As directed    Follow up with your primary care physician to discuss the medication changes during this admission. You need tighter blood glucose control. Your PCP can help you achieve this   Increase activity slowly   Complete by: As directed       Allergies as of 02/06/2024       Reactions   Drug Class [ace  Inhibitors] Swelling   Swelling        Medication List     PAUSE taking these medications    spironolactone 25 MG tablet Wait to take this until your doctor or other care provider tells you to start again. Commonly known as: ALDACTONE Take 25 mg by mouth daily.       TAKE these medications    amitriptyline 75 MG tablet Commonly known as: ELAVIL Take 75 mg by mouth at bedtime.   amLODipine  10 MG tablet Commonly known as: NORVASC  Take 10 mg by mouth daily.   amoxicillin-clavulanate 875-125 MG tablet Commonly known as: AUGMENTIN Take 1 tablet by mouth every 12 (twelve) hours for 5 days.   aspirin 81 MG chewable tablet Chew 81 mg by mouth daily.   atorvastatin  10 MG tablet Commonly known as: LIPITOR Take 1 tablet (10 mg total) by mouth daily. Does not know dose   cyclobenzaprine 10 MG tablet Commonly known as: FLEXERIL Take 10 mg by mouth 3 (three) times daily as needed.   empagliflozin 25 MG Tabs tablet Commonly known as: JARDIANCE Take 25 mg by mouth daily.   fenofibrate  48 MG tablet Commonly known as: TRICOR  Take 48 mg by mouth daily. Does not know dose   gabapentin 800 MG tablet Commonly known as: NEURONTIN Take 800 mg by mouth 3 (three) times daily.   HumaLOG KwikPen 100 UNIT/ML KwikPen Generic drug: insulin  lispro Inject 15 Units into the skin 3 (three) times daily.   insulin  glargine 100 UNIT/ML injection Commonly known as: LANTUS  Inject 30 Units into the skin daily.   losartan 50 MG tablet Commonly known as: COZAAR Take 50 mg by mouth daily.   meloxicam 15 MG tablet Commonly known as: MOBIC Take 15 mg by mouth daily.   metFORMIN 500 MG tablet Commonly known as: GLUCOPHAGE Take 500 mg by mouth daily with breakfast. Not sure of the dose   metoprolol  succinate 50 MG 24 hr tablet Commonly known as: TOPROL -XL Take 50 mg by mouth daily.   mupirocin  ointment 2 % Commonly known as: BACTROBAN  Apply 1 Application topically 2 (two) times  daily.   nystatin ointment Commonly known as: MYCOSTATIN Apply 1 Application topically 2 (two) times daily.   omeprazole 20 MG capsule Commonly known as: PRILOSEC Take 20 mg by mouth daily.   oxyCODONE  5 MG immediate release tablet Commonly known as: Oxy IR/ROXICODONE  Take 1 tablet (5 mg total) by mouth every 6 (six) hours as needed for severe pain.   sitaGLIPtin 100 MG tablet Commonly known as: JANUVIA Take 100 mg by mouth daily.   thiamine  100 MG tablet Commonly known as: VITAMIN B1 Take 1 tablet (100 mg total) by mouth daily.   triamcinolone  ointment 0.5 % Commonly known as: KENALOG  Apply 1 Application topically 2 (two) times daily.        Follow-up Information     Dyane Carlyon Norris, FNP Follow up.   Specialty: Family Medicine Why: Patient making own follow up appt   Hospital follow up Contact information: [id number] [place] [address] [phone]                Allergies  Allergen Reactions   Drug Class [Ace Inhibitors] Swelling    Swelling    Consultations:    Procedures/Studies: CT Cervical Spine Wo Contrast Result Date: 02/04/2024 CLINICAL DATA:  Patient found unresponsive. EXAM: CT CERVICAL SPINE WITHOUT CONTRAST TECHNIQUE: Multidetector CT imaging of the cervical spine was performed without intravenous contrast. Multiplanar CT image reconstructions were also generated. RADIATION DOSE REDUCTION: This exam was performed according to the departmental dose-optimization program which includes automated exposure control, adjustment of the mA and/or kV according to patient size and/or use of iterative reconstruction technique. COMPARISON:  None Available. FINDINGS: Alignment: There is mild reversal of the normal cervical spine lordosis. Skull base and vertebrae: No acute fracture. No primary bone lesion or focal pathologic process. Soft tissues and spinal canal: No prevertebral fluid or swelling. No visible canal hematoma. Disc levels:  Mild anterior osteophyte formation is seen at the levels of C4-C5, C5-C6 and C6-C7. Mild multilevel intervertebral disc space narrowing is seen throughout the cervical spine. Mild, bilateral multilevel facet joint hypertrophy is noted. Upper chest: Negative. Other: None. IMPRESSION: 1. Mild reversal of the normal cervical spine lordosis, which may be due to positioning or muscle spasm. 2. Mild multilevel degenerative changes without evidence of an acute fracture or subluxation. Electronically Signed   By: Suzen Dials M.D.   On: 02/04/2024 20:10   CT Head Wo Contrast Result Date: 02/04/2024 CLINICAL DATA:  Patient found unresponsive. EXAM: CT HEAD WITHOUT CONTRAST TECHNIQUE: Contiguous axial images were obtained from the base of the skull through the vertex without intravenous contrast. RADIATION DOSE REDUCTION: This exam was performed according to the departmental dose-optimization program which includes automated exposure control, adjustment of the mA and/or kV according to patient size and/or use of iterative reconstruction technique. COMPARISON:  None Available. FINDINGS: Brain: No evidence of acute infarction, hemorrhage, hydrocephalus, extra-axial collection or mass lesion/mass effect. Vascular: Moderate severity bilateral cavernous carotid artery calcification is noted. Skull: Normal. Negative for fracture or focal lesion. Sinuses/Orbits: There is moderate severity right-sided sphenoid sinus mucosal thickening. Other: None. IMPRESSION: 1. No acute intracranial abnormality. 2. Moderate severity right-sided sphenoid sinus disease. Electronically Signed   By: Suzen Dials M.D.   On: 02/04/2024 20:06      Discharge Exam: Vitals:   02/06/24 0436 02/06/24 0721  BP: 134/85 (!) 148/93  Pulse: 90 91  Resp:  16  Temp: 98.2 F (36.8 C) 98.1 F (36.7 C)  SpO2: 98% 100%   Vitals:   02/05/24 1954 02/06/24 0009 02/06/24 0436 02/06/24 0721  BP: (!) 152/97 (!) 139/98 134/85 (!) 148/93  Pulse:  92 (!) 103 90 91  Resp:    16  Temp: 98.5 F (36.9 C) 98.6 F (37 C) 98.2 F (36.8 C) 98.1 F (36.7 C)  TempSrc:      SpO2: 100% 100% 98% 100%  Weight:      Height:        Constitutional:  Normal appearance. Non toxic-appearing.  HENT: Head Normocephalic and atraumatic.  Mucous membranes are moist.  Eyes:  Extraocular intact. Conjunctivae normal.  Cardiovascular: Rate and Rhythm: Normal rate and regular rhythm.  Pulmonary: Non labored, symmetric rise of chest wall.  Skin: warm and dry. not jaundiced.  Neurological: No focal deficit present. alert. Oriented.  Psychiatric: Mood and Affect congruent. Irritable.   The results of significant diagnostics from this hospitalization (including imaging, microbiology, ancillary  and laboratory) are listed below for reference.     Microbiology: No results found for this or any previous visit (from the past 240 hours).   Labs: BNP (last 3 results) No results for input(s): BNP in the last 8760 hours. Basic Metabolic Panel: Recent Labs  Lab 02/04/24 1831 02/05/24 0500  NA 138 138  K 3.5 3.5  CL 104 105  CO2 23 23  GLUCOSE 161* 68*  BUN 5* <5*  CREATININE 0.80 0.56*  CALCIUM  8.1* 7.6*   Liver Function Tests: Recent Labs  Lab 02/04/24 1831  AST 230*  ALT 112*  ALKPHOS 176*  BILITOT 0.6  PROT 6.0*  ALBUMIN 3.0*   No results for input(s): LIPASE, AMYLASE in the last 168 hours. No results for input(s): AMMONIA in the last 168 hours. CBC: Recent Labs  Lab 02/04/24 1831 02/05/24 0500  WBC 7.1 5.4  HGB 11.9* 10.6*  HCT 37.0* 31.6*  MCV 96.4 91.6  PLT 157 140*   Cardiac Enzymes: No results for input(s): CKTOTAL, CKMB, CKMBINDEX, TROPONINI in the last 168 hours. BNP: Invalid input(s): POCBNP CBG: Recent Labs  Lab 02/05/24 1145 02/05/24 1629 02/05/24 1956 02/05/24 2204 02/06/24 0722  GLUCAP 113* 78 90 112* 145*   D-Dimer No results for input(s): DDIMER in the last 72 hours. Hgb  A1c Recent Labs    02/04/24 1831  HGBA1C 9.3*   Lipid Profile No results for input(s): CHOL, HDL, LDLCALC, TRIG, CHOLHDL, LDLDIRECT in the last 72 hours. Thyroid function studies No results for input(s): TSH, T4TOTAL, T3FREE, THYROIDAB in the last 72 hours.  Invalid input(s): FREET3 Anemia work up No results for input(s): VITAMINB12, FOLATE, FERRITIN, TIBC, IRON, RETICCTPCT in the last 72 hours. Urinalysis    Component Value Date/Time   COLORURINE STRAW (A) 10/26/2020 1816   APPEARANCEUR CLEAR (A) 10/26/2020 1816   LABSPEC >1.046 (H) 10/26/2020 1816   PHURINE 5.0 10/26/2020 1816   GLUCOSEU >=500 (A) 10/26/2020 1816   HGBUR SMALL (A) 10/26/2020 1816   BILIRUBINUR NEGATIVE 10/26/2020 1816   KETONESUR 20 (A) 10/26/2020 1816   PROTEINUR NEGATIVE 10/26/2020 1816   NITRITE NEGATIVE 10/26/2020 1816   LEUKOCYTESUR NEGATIVE 10/26/2020 1816   Sepsis Labs Recent Labs  Lab 02/04/24 1831 02/05/24 0500  WBC 7.1 5.4   Microbiology No results found for this or any previous visit (from the past 240 hours).   Time coordinating discharge: 32 min   SIGNED: Lorane Poland, MD  [facility] 02/06/2024, 11:52 AM Pager   If 7PM-7AM, please contact night-coverage

## 2024-03-18 ENCOUNTER — Encounter: Payer: Self-pay | Admitting: Emergency Medicine

## 2024-03-18 ENCOUNTER — Emergency Department

## 2024-03-18 ENCOUNTER — Observation Stay
Admission: EM | Admit: 2024-03-18 | Discharge: 2024-03-20 | Disposition: A | Discharge status: Home / Self Care | Attending: Emergency Medicine | Admitting: Emergency Medicine

## 2024-03-18 ENCOUNTER — Other Ambulatory Visit: Payer: Self-pay

## 2024-03-18 DIAGNOSIS — E162 Hypoglycemia, unspecified: Principal | ICD-10-CM | POA: Diagnosis present

## 2024-03-18 DIAGNOSIS — R531 Weakness: Secondary | ICD-10-CM | POA: Diagnosis present

## 2024-03-18 DIAGNOSIS — E1169 Type 2 diabetes mellitus with other specified complication: Secondary | ICD-10-CM

## 2024-03-18 DIAGNOSIS — E11649 Type 2 diabetes mellitus with hypoglycemia without coma: Secondary | ICD-10-CM

## 2024-03-18 DIAGNOSIS — Z1152 Encounter for screening for COVID-19: Secondary | ICD-10-CM | POA: Insufficient documentation

## 2024-03-18 DIAGNOSIS — F1010 Alcohol abuse, uncomplicated: Secondary | ICD-10-CM | POA: Diagnosis not present

## 2024-03-18 DIAGNOSIS — E785 Hyperlipidemia, unspecified: Secondary | ICD-10-CM | POA: Diagnosis not present

## 2024-03-18 DIAGNOSIS — Z794 Long term (current) use of insulin: Secondary | ICD-10-CM | POA: Insufficient documentation

## 2024-03-18 DIAGNOSIS — I110 Hypertensive heart disease with heart failure: Secondary | ICD-10-CM | POA: Insufficient documentation

## 2024-03-18 DIAGNOSIS — E876 Hypokalemia: Secondary | ICD-10-CM | POA: Insufficient documentation

## 2024-03-18 DIAGNOSIS — Z7982 Long term (current) use of aspirin: Secondary | ICD-10-CM | POA: Diagnosis not present

## 2024-03-18 DIAGNOSIS — K8500 Idiopathic acute pancreatitis without necrosis or infection: Secondary | ICD-10-CM

## 2024-03-18 DIAGNOSIS — I5022 Chronic systolic (congestive) heart failure: Secondary | ICD-10-CM | POA: Diagnosis not present

## 2024-03-18 LAB — LIPASE, BLOOD: Lipase: 20 U/L (ref 11–51)

## 2024-03-18 LAB — CBC
HCT: 36.5 % — ABNORMAL LOW (ref 39.0–52.0)
Hemoglobin: 12.5 g/dL — ABNORMAL LOW (ref 13.0–17.0)
MCH: 32.5 pg (ref 26.0–34.0)
MCHC: 34.2 g/dL (ref 30.0–36.0)
MCV: 94.8 fL (ref 80.0–100.0)
Platelets: 120 10*3/uL — ABNORMAL LOW (ref 150–400)
RBC: 3.85 MIL/uL — ABNORMAL LOW (ref 4.22–5.81)
RDW: 13.8 % (ref 11.5–15.5)
WBC: 6.9 10*3/uL (ref 4.0–10.5)
nRBC: 0 % (ref 0.0–0.2)

## 2024-03-18 LAB — DIFFERENTIAL
Abs Immature Granulocytes: 0.01 10*3/uL (ref 0.00–0.07)
Basophils Absolute: 0 10*3/uL (ref 0.0–0.1)
Basophils Relative: 0 %
Eosinophils Absolute: 0.1 10*3/uL (ref 0.0–0.5)
Eosinophils Relative: 1 %
Immature Granulocytes: 0 %
Lymphocytes Relative: 19 %
Lymphs Abs: 1.3 10*3/uL (ref 0.7–4.0)
Monocytes Absolute: 0.6 10*3/uL (ref 0.1–1.0)
Monocytes Relative: 9 %
Neutro Abs: 4.9 10*3/uL (ref 1.7–7.7)
Neutrophils Relative %: 71 %

## 2024-03-18 LAB — COMPREHENSIVE METABOLIC PANEL WITH GFR
ALT: 57 U/L — ABNORMAL HIGH (ref 0–44)
AST: 111 U/L — ABNORMAL HIGH (ref 15–41)
Albumin: 2.9 g/dL — ABNORMAL LOW (ref 3.5–5.0)
Alkaline Phosphatase: 157 U/L — ABNORMAL HIGH (ref 38–126)
Anion gap: 13 (ref 5–15)
BUN: <5 mg/dL — ABNORMAL LOW (ref 6–20)
CO2: 23 mmol/L (ref 22–32)
Calcium: 8 mg/dL — ABNORMAL LOW (ref 8.9–10.3)
Chloride: 102 mmol/L (ref 98–111)
Creatinine, Ser: 0.87 mg/dL (ref 0.61–1.24)
GFR, Estimated: >60 mL/min (ref >60)
Glucose, Bld: 38 mg/dL — CL (ref 70–99)
Potassium: 3 mmol/L — ABNORMAL LOW (ref 3.5–5.1)
Sodium: 138 mmol/L (ref 135–145)
Total Bilirubin: 1 mg/dL (ref 0.0–1.2)
Total Protein: 5.9 g/dL — ABNORMAL LOW (ref 6.5–8.1)

## 2024-03-18 LAB — GLUCOSE, CAPILLARY
Glucose-Capillary: 185 mg/dL — ABNORMAL HIGH (ref 70–99)
Glucose-Capillary: 128 mg/dL — ABNORMAL HIGH (ref 70–99)

## 2024-03-18 LAB — ETHANOL: Alcohol, Ethyl (B): <15 mg/dL

## 2024-03-18 LAB — TROPONIN I (HIGH SENSITIVITY)
Troponin I (High Sensitivity): 9 ng/L (ref <18)
Troponin I (High Sensitivity): 10 ng/L (ref <18)

## 2024-03-18 LAB — PROTIME-INR
INR: 1 (ref 0.8–1.2)
Prothrombin Time: 14 s (ref 11.4–15.2)

## 2024-03-18 LAB — MAGNESIUM: Magnesium: 1.3 mg/dL — ABNORMAL LOW (ref 1.7–2.4)

## 2024-03-18 LAB — CBG MONITORING, ED
Glucose-Capillary: 174 mg/dL — ABNORMAL HIGH (ref 70–99)
Glucose-Capillary: 33 mg/dL — CL (ref 70–99)
Glucose-Capillary: 97 mg/dL (ref 70–99)

## 2024-03-18 LAB — APTT: aPTT: 27 s (ref 24–36)

## 2024-03-18 MED ORDER — ONDANSETRON HCL 4 MG PO TABS: 4.0000 mg | ORAL_TABLET | Freq: Four times a day (QID) | ORAL | Status: DC | PRN

## 2024-03-18 MED ORDER — POTASSIUM CHLORIDE CRYS ER 20 MEQ PO TBCR
40.0000 meq | EXTENDED_RELEASE_TABLET | Freq: Once | ORAL | Status: AC
  Administered 2024-03-18 (×2): 40 meq via ORAL
  Filled 2024-03-18: qty 2

## 2024-03-18 MED ORDER — ATORVASTATIN CALCIUM 80 MG PO TABS
80.0000 mg | ORAL_TABLET | Freq: Every day | ORAL | Status: DC
  Administered 2024-03-18 – 2024-03-19 (×4): 80 mg via ORAL
  Filled 2024-03-18: qty 4
  Filled 2024-03-18 (×2): qty 1
  Filled 2024-03-18: qty 4

## 2024-03-18 MED ORDER — ASPIRIN 81 MG PO CHEW
81.0000 mg | CHEWABLE_TABLET | Freq: Every day | ORAL | Status: DC
  Administered 2024-03-18 – 2024-03-19 (×4): 81 mg via ORAL
  Filled 2024-03-18 (×2): qty 1

## 2024-03-18 MED ORDER — THIAMINE HCL 100 MG/ML IJ SOLN
100.0000 mg | Freq: Once | INTRAMUSCULAR | Status: AC
  Administered 2024-03-18 (×2): 100 mg via INTRAVENOUS
  Filled 2024-03-18: qty 2

## 2024-03-18 MED ORDER — CYCLOBENZAPRINE HCL 10 MG PO TABS
10.0000 mg | ORAL_TABLET | Freq: Three times a day (TID) | ORAL | Status: DC | PRN
  Filled 2024-03-18: qty 1

## 2024-03-18 MED ORDER — OXYCODONE HCL 5 MG PO TABS
5.0000 mg | ORAL_TABLET | Freq: Four times a day (QID) | ORAL | Status: DC | PRN
  Administered 2024-03-18 – 2024-03-19 (×8): 5 mg via ORAL
  Filled 2024-03-18 (×4): qty 1

## 2024-03-18 MED ORDER — ENOXAPARIN SODIUM 40 MG/0.4ML IJ SOSY: 40.0000 mg | PREFILLED_SYRINGE | INTRAMUSCULAR | Status: DC

## 2024-03-18 MED ORDER — SODIUM CHLORIDE 0.9% FLUSH
3.0000 mL | Freq: Once | INTRAVENOUS | Status: AC
  Administered 2024-03-18 (×2): 3 mL via INTRAVENOUS

## 2024-03-18 MED ORDER — KETOROLAC TROMETHAMINE 30 MG/ML IJ SOLN
30.0000 mg | Freq: Once | INTRAMUSCULAR | Status: AC
  Administered 2024-03-18 (×2): 30 mg via INTRAVENOUS
  Filled 2024-03-18: qty 1

## 2024-03-18 MED ORDER — AMLODIPINE BESYLATE 10 MG PO TABS
10.0000 mg | ORAL_TABLET | Freq: Every day | ORAL | Status: DC
  Administered 2024-03-18 – 2024-03-20 (×6): 10 mg via ORAL
  Filled 2024-03-18 (×2): qty 1
  Filled 2024-03-18: qty 2

## 2024-03-18 MED ORDER — INSULIN ASPART 100 UNIT/ML IJ SOLN
0.0000 [IU] | Freq: Three times a day (TID) | INTRAMUSCULAR | Status: DC
  Administered 2024-03-18 (×2): 3 [IU] via SUBCUTANEOUS
  Filled 2024-03-18: qty 1

## 2024-03-18 MED ORDER — METOPROLOL SUCCINATE ER 50 MG PO TB24
50.0000 mg | ORAL_TABLET | Freq: Every day | ORAL | Status: DC
  Administered 2024-03-18 – 2024-03-20 (×6): 50 mg via ORAL
  Filled 2024-03-18 (×3): qty 1

## 2024-03-18 MED ORDER — MAGNESIUM SULFATE 4 GM/100ML IV SOLN
4.0000 g | Freq: Once | INTRAVENOUS | Status: AC
  Administered 2024-03-18 (×2): 4 g via INTRAVENOUS
  Filled 2024-03-18: qty 100

## 2024-03-18 MED ORDER — LOSARTAN POTASSIUM 50 MG PO TABS: 50.0000 mg | ORAL_TABLET | Freq: Every day | ORAL | Status: DC

## 2024-03-18 MED ORDER — METFORMIN HCL 500 MG PO TABS
500.0000 mg | ORAL_TABLET | Freq: Every day | ORAL | Status: DC
  Administered 2024-03-19 – 2024-03-20 (×4): 500 mg via ORAL
  Filled 2024-03-18 (×2): qty 1

## 2024-03-18 MED ORDER — LINAGLIPTIN 5 MG PO TABS: 5.0000 mg | ORAL_TABLET | Freq: Every day | ORAL | Status: DC

## 2024-03-18 MED ORDER — AMITRIPTYLINE HCL 25 MG PO TABS
75.0000 mg | ORAL_TABLET | Freq: Every day | ORAL | Status: DC
  Administered 2024-03-18 – 2024-03-19 (×4): 75 mg via ORAL
  Filled 2024-03-18 (×2): qty 3

## 2024-03-18 MED ORDER — DEXTROSE 50 % IV SOLN
25.0000 g | Freq: Once | INTRAVENOUS | Status: AC
  Administered 2024-03-18 (×2): 25 g via INTRAVENOUS

## 2024-03-18 MED ORDER — GABAPENTIN 400 MG PO CAPS
800.0000 mg | ORAL_CAPSULE | Freq: Three times a day (TID) | ORAL | Status: DC
  Administered 2024-03-18 – 2024-03-20 (×12): 800 mg via ORAL
  Filled 2024-03-18: qty 8
  Filled 2024-03-18: qty 2
  Filled 2024-03-18: qty 8
  Filled 2024-03-18 (×4): qty 2
  Filled 2024-03-18: qty 8
  Filled 2024-03-18 (×2): qty 2
  Filled 2024-03-18: qty 8

## 2024-03-18 MED ORDER — ONDANSETRON HCL 4 MG/2ML IJ SOLN: 4.0000 mg | Freq: Four times a day (QID) | INTRAMUSCULAR | Status: DC | PRN

## 2024-03-18 MED ORDER — FENOFIBRATE 54 MG PO TABS
54.0000 mg | ORAL_TABLET | Freq: Every day | ORAL | Status: DC
  Administered 2024-03-18 – 2024-03-20 (×6): 54 mg via ORAL
  Filled 2024-03-18 (×4): qty 1

## 2024-03-18 MED ORDER — PANTOPRAZOLE SODIUM 40 MG PO TBEC
40.0000 mg | DELAYED_RELEASE_TABLET | Freq: Every day | ORAL | Status: DC
  Administered 2024-03-18 – 2024-03-20 (×6): 40 mg via ORAL
  Filled 2024-03-18 (×3): qty 1

## 2024-03-18 MED ORDER — NAPROXEN 500 MG PO TABS: 250.0000 mg | ORAL_TABLET | Freq: Three times a day (TID) | ORAL | Status: DC | PRN

## 2024-03-18 NOTE — ED Notes (Signed)
 Code stroke cancelled at this time per Dr. Matthews.

## 2024-03-18 NOTE — ED Triage Notes (Signed)
 Pt from home via [facility] with reports of AMS and fall from couch this morning at 0830. Per Ems pt was LKW last night at 2130. Pt was found to be altered with right sided weakness and slurred speech. Pt pupils pinpoint per Ems and pt was given 2mg  Narcan  with no improvement.

## 2024-03-18 NOTE — H&P (Signed)
 History and Physical    Micheal Wong FMW:[id number] DOB: 29-Jan-1971 DOA: 03/18/2024  PCP: Dyane Carlyon Norris, FNP (Confirm with patient/family/NH records and if not entered, this has to be entered at [facility] point of entry) Patient coming from: Home  I have personally briefly reviewed patient's old medical records in [facility]  Chief Complaint: Feeling weak, nausea  HPI: Micheal Wong is a 53 y.o. male with medical history significant of IDDM, hypertension, pancreatitis recent rib fracture, chronic HFrEF HFrEF 40%, HLD, presented with hypoglycemia.  Patient was recently hospitalized for hypoglycemia at due to inappropriate dose of insulin  especially Lantus , patient was instructed to cut off Lantus  and reduce Humalog  dosage, patient however did not follow instruction, he told me that he still uses the same 3 unit of Lantus  daily at night and 15 units of Humalog  before each meal.  Lately he has been experience more frequent episodes of fatigue, but he told me that due to insurance issue he did not use any glucometer to check his fingerstick.  Patient was found unresponsive today at work and EMS also called in.  EMS found the patient appeared to have weakness of right side and code stroke was called, glucose in the field was 78, patient also received 2 mg of Narcan .  ED Course: Afebrile, tachycardia heart rate 100, blood pressure elevated 160/110, nonhypoxic.  CT head negative for acute findings, glucose 30, code stroke canceled.  K3.0 BUN 5 creatinine 0.8 glucose 38  Patient was given D50 in the ED.  Review of Systems: As per HPI otherwise 14 point review of systems negative.    Past Medical History:  Diagnosis Date   Diabetes mellitus without complication (HCC)    Hypertension    Pancreatitis     History reviewed. No pertinent surgical history.   reports that he has been smoking cigarettes. He has never used smokeless tobacco. He reports current alcohol use. He reports that he  does not currently use drugs.  Allergies  Allergen Reactions   Drug Class [Ace Inhibitors] Swelling    Swelling    History reviewed. No pertinent family history.   Prior to Admission medications   Medication Sig Start Date End Date Taking? Authorizing Provider  amitriptyline  (ELAVIL ) 75 MG tablet Take 75 mg by mouth at bedtime. 10/19/23   [provider]  amLODipine  (NORVASC ) 10 MG tablet Take 10 mg by mouth daily.    [provider]  aspirin  81 MG chewable tablet Chew 81 mg by mouth daily. 11/27/23 11/26/24  [provider]  atorvastatin  (LIPITOR ) 10 MG tablet Take 1 tablet (10 mg total) by mouth daily. Does not know dose 02/29/20   Josette Ade, MD  cyclobenzaprine  (FLEXERIL ) 10 MG tablet Take 10 mg by mouth 3 (three) times daily as needed. 01/31/24   [provider]  empagliflozin  (JARDIANCE ) 25 MG TABS tablet Take 25 mg by mouth daily. 01/11/24 01/10/25  [provider]  fenofibrate  (TRICOR ) 48 MG tablet Take 48 mg by mouth daily. Does not know dose    [provider]  gabapentin  (NEURONTIN ) 800 MG tablet Take 800 mg by mouth 3 (three) times daily.    [provider]  HUMALOG  KWIKPEN 100 UNIT/ML KwikPen Inject 15 Units into the skin 3 (three) times daily. 06/03/22   [provider]  insulin  glargine (LANTUS ) 100 UNIT/ML injection Inject 30 Units into the skin daily.    [provider]  losartan  (COZAAR ) 50 MG tablet Take 50 mg by mouth daily. 11/27/23  11/26/24  [provider]  meloxicam (MOBIC) 15 MG tablet Take 15 mg by mouth daily. 06/03/22   [provider]  metFORMIN  (GLUCOPHAGE ) 500 MG tablet Take 500 mg by mouth daily with breakfast. Not sure of the dose    [provider]  metoprolol  succinate (TOPROL -XL) 50 MG 24 hr tablet Take 50 mg by mouth daily. 06/03/22 06/19/24  [provider]  mupirocin  ointment (BACTROBAN ) 2 % Apply 1 Application topically 2 (two) times  daily. Patient not taking: Reported on 02/04/2024 03/11/23   Gasper Devere ORN, PA-C  nystatin ointment (MYCOSTATIN) Apply 1 Application topically 2 (two) times daily. 10/19/23 10/18/24  [provider]  omeprazole (PRILOSEC) 20 MG capsule Take 20 mg by mouth daily. 09/20/23   [provider]  oxyCODONE  (OXY IR/ROXICODONE ) 5 MG immediate release tablet Take 1 tablet (5 mg total) by mouth every 6 (six) hours as needed for severe pain. 02/29/20   Josette Ade, MD  sitaGLIPtin (JANUVIA) 100 MG tablet Take 100 mg by mouth daily. 06/03/22   [provider]  spironolactone (ALDACTONE) 25 MG tablet Take 25 mg by mouth daily. 11/27/23 11/26/24  [provider]  thiamine  100 MG tablet Take 1 tablet (100 mg total) by mouth daily. 03/01/20   Josette Ade, MD  triamcinolone  ointment (KENALOG ) 0.5 % Apply 1 Application topically 2 (two) times daily. 03/11/23   Gasper Devere ORN, PA-C    Physical Exam: Vitals:   03/18/24 1000 03/18/24 1006 03/18/24 1009 03/18/24 1030  BP: (!) 157/117 (!) 147/109  (!) 143/106  Pulse: (!) 104 (!) 102 (!) 104 (!) 105  Resp: 16 17 14 16   Temp:   98.1 F (36.7 C)   TempSrc:   Oral   SpO2: 100% 100% 100% 100%    Constitutional: NAD, calm, comfortable Vitals:   03/18/24 1000 03/18/24 1006 03/18/24 1009 03/18/24 1030  BP: (!) 157/117 (!) 147/109  (!) 143/106  Pulse: (!) 104 (!) 102 (!) 104 (!) 105  Resp: 16 17 14 16   Temp:   98.1 F (36.7 C)   TempSrc:   Oral   SpO2: 100% 100% 100% 100%   Eyes: PERRL, lids and conjunctivae normal ENMT: Mucous membranes are moist. Posterior pharynx clear of any exudate or lesions.Normal dentition.  Neck: normal, supple, no masses, no thyromegaly Respiratory: clear to auscultation bilaterally, no wheezing, no crackles. Normal respiratory effort. No accessory muscle use.  Cardiovascular: Regular rate and rhythm, no murmurs / rubs / gallops. No extremity edema. 2+ pedal pulses. No carotid bruits.  Abdomen: no  tenderness, no masses palpated. No hepatosplenomegaly. Bowel sounds positive.  Musculoskeletal: no clubbing / cyanosis. No joint deformity upper and lower extremities. Good ROM, no contractures. Normal muscle tone.  Skin: no rashes, lesions, ulcers. No induration Neurologic: CN 2-12 grossly intact. Sensation intact, DTR normal. Strength 5/5 in all 4.  Psychiatric: Normal judgment and insight. Alert and oriented x 3. Normal mood.     Labs on Admission: I have personally reviewed following labs and imaging studies  CBC: Recent Labs  Lab 03/18/24 0932  WBC 6.9  NEUTROABS 4.9  HGB 12.5*  HCT 36.5*  MCV 94.8  PLT 120*   Basic Metabolic Panel: Recent Labs  Lab 03/18/24 0932  NA 138  K 3.0*  CL 102  CO2 23  GLUCOSE 38*  BUN <5*  CREATININE 0.87  CALCIUM  8.0*   GFR: CrCl cannot be calculated (Unknown ideal weight.). Liver Function Tests: Recent Labs  Lab 03/18/24 0932  AST 111*  ALT 57*  ALKPHOS 157*  BILITOT 1.0  PROT 5.9*  ALBUMIN 2.9*   Recent Labs  Lab 03/18/24 0932  LIPASE 20   No results for input(s): AMMONIA in the last 168 hours. Coagulation Profile: Recent Labs  Lab 03/18/24 0932  INR 1.0   Cardiac Enzymes: No results for input(s): CKTOTAL, CKMB, CKMBINDEX, TROPONINI in the last 168 hours. BNP (last 3 results) No results for input(s): PROBNP in the last 8760 hours. HbA1C: No results for input(s): HGBA1C in the last 72 hours. CBG: Recent Labs  Lab 03/18/24 0946  GLUCAP 174*   Lipid Profile: No results for input(s): CHOL, HDL, LDLCALC, TRIG, CHOLHDL, LDLDIRECT in the last 72 hours. Thyroid Function Tests: No results for input(s): TSH, T4TOTAL, FREET4, T3FREE, THYROIDAB in the last 72 hours. Anemia Panel: No results for input(s): VITAMINB12, FOLATE, FERRITIN, TIBC, IRON, RETICCTPCT in the last 72 hours. Urine analysis:    Component Value Date/Time   COLORURINE STRAW (A) 10/26/2020 1816    APPEARANCEUR CLEAR (A) 10/26/2020 1816   LABSPEC >1.046 (H) 10/26/2020 1816   PHURINE 5.0 10/26/2020 1816   GLUCOSEU >=500 (A) 10/26/2020 1816   HGBUR SMALL (A) 10/26/2020 1816   BILIRUBINUR NEGATIVE 10/26/2020 1816   KETONESUR 20 (A) 10/26/2020 1816   PROTEINUR NEGATIVE 10/26/2020 1816   NITRITE NEGATIVE 10/26/2020 1816   LEUKOCYTESUR NEGATIVE 10/26/2020 1816    Radiological Exams on Admission: CT Cervical Spine Wo Contrast Result Date: 03/18/2024 EXAM: CT CERVICAL SPINE WITHOUT CONTRAST 03/18/2024 09:48:00 AM TECHNIQUE: CT of the cervical spine was performed without the administration of intravenous contrast. Multiplanar reformatted images are provided for review. Automated exposure control, iterative reconstruction, and/or weight based adjustment of the mA/kV was utilized to reduce the radiation dose to as low as reasonably achievable. COMPARISON: CT of the cervical spine dated 02/04/2024. CLINICAL HISTORY: Facial trauma, blunt. Fall, pt rolled off couch this AM, found down, also code stroke. FINDINGS: CERVICAL SPINE: BONES AND ALIGNMENT: No acute fracture or traumatic malalignment. DEGENERATIVE CHANGES: No significant degenerative changes. SOFT TISSUES: There is a simple cyst present in the subcutaneous fat of the upper back on the left, measuring approximately 20 x 15 x 16 mm. IMPRESSION: 1. No acute abnormality of the cervical spine related to the reported facial trauma and fall. Electronically signed by: evalene coho 03/18/2024 10:43 AM EDT RP Workstation: [id number]   CT HEAD CODE STROKE WO CONTRAST Result Date: 03/18/2024 EXAM: CT HEAD WITHOUT 03/18/2024 09:43:00 AM TECHNIQUE: CT of the head was performed without the administration of intravenous contrast. Automated exposure control, iterative reconstruction, and/or weight based adjustment of the mA/kV was utilized to reduce the radiation dose to as low as reasonably achievable. COMPARISON: CT of the head dated February 04, 2024. CLINICAL  HISTORY: Neuro deficit, acute, stroke suspected. Code stroke, LWK 9:30pm 8/10, lethargy, right sided weakness; Removed earrings; Dr Matthews [phone] FINDINGS: BRAIN AND VENTRICLES: No acute intracranial hemorrhage. No mass effect or midline shift. No extra-axial fluid collection. Gray-white differentiation is maintained. No hydrocephalus. There is mild periventricular white matter disease present. There is no evidence of acute infarct. [place] stroke program early CT (ASPECT) score: Ganglionic (caudate, IC, lentiform nucleus, insula, M1-M3): 7 Supraganglionic (M4-M6): 3 Total: 10 ORBITS: No acute abnormality. SINUSES AND MASTOIDS: No acute abnormality. SOFT TISSUES AND SKULL: No acute skull fracture. No acute soft tissue abnormality. IMPRESSION: 1. No acute intracranial abnormality. Aspect score is 10. 2. Mild periventricular white matter disease. 3. The above findings were communicated to Dr. Matthews  at 9:48 am 03/18/2024 via Amion. Electronically signed by: evalene coho 03/18/2024 09:52 AM EDT RP Workstation: [id number]    EKG: Independently reviewed.  Sinus tachycardia, no acute ST changes  Assessment/Plan Principal Problem:   Hypoglycemia  (please populate well all problems here in Problem List. (For example, if patient is on BP meds at home and you resume or decide to hold them, it is a problem that needs to be her. Same for CAD, COPD, HLD and so on)  Hypoglycemia, improving - Secondary to inappropriately insulin  dosage especially Lantus  - Discontinue Lantus , patient agreed - Recommend patient checks fingerstick 3 times a day for 1 week and keep a record to show to PCP in 1 week, patient agreed - Hold off standing dose of Humalog , start SSI, will decide Humalog  dosage for patient to go home - Hold off Jardiance  today, continue metformin  and Januvia - Diabetic coordinator consulted  Hypokalemia -P.o. replacement -Magnesium  pending  HTN, uncontrolled Chronic HFrEF -Euvolemic -Continue  home BP meds  HLD -Continue statin  Alcohol abuse History of pancreatitis -Last used yesterday, no symptoms or signs of acute withdrawal -No abdominal pain lipase level normal  Total time spent on patient care 55 minutes  DVT prophylaxis: Lovenox  Code Status: Full code Family Communication: None at bedside Disposition Plan: Expect less than 2 midnight hospital stay Consults called: None Admission status: MedSurg observation   Cort ONEIDA Mana MD [facility] Pager [phone] 03/18/2024, 11:24 AM

## 2024-03-18 NOTE — ED Notes (Signed)
 Pt given graham crackers to eat per pts request.

## 2024-03-18 NOTE — Consult Note (Addendum)
 NEUROLOGY CONSULT NOTE   Date of service: March 19, 2024 Patient Name: Micheal Wong MRN:  [id number] DOB:  05-11-1971 Chief Complaint: hypoglycemia, activate as code stroke Requesting Provider: Marsa Edelman, DO  History of Present Illness  Micheal Wong is a 53 y.o. male with hx of diabetes, hypertension, pancreatitis, chronic systolic heart failure with an EF of 40%, hyperlipidemia who presented with hypoglycemia and diffuse weakness and confusion. LKW 2130 the night before.  He was recently hospitalized here for hypoglycemia due to inappropriate dose of insulin  especially Lantus .  He was given specific instructions at discharge to stop Lantus  and reduce the Humalog  dosage however he became confused about those instructions and did not follow them.  He has still been using the same 3 units of Lantus  daily at night and 15 units of Humalog  before each meal which landed him in the hospital last time.  Lately he has been experiencing more frequent episodes of dizziness and fatigue but he told me that due to insurance issues he did not use a glucometer at home.  Today patient was found unresponsive at work and EMS was called then.  And thus felt that patient had some weakness of the right side and a code stroke was called.  Glucose in the field was 78 on EMS' glucometer but first accucheck when arriving to ED showed BG 30. D50 was administered per protocol and his symptoms began to quickly resolve. Stroke code head CT showed no acute intraacranial process on personal review. By the end of the stroke code he was completely back to baseline with no neurologic deficits.  LKW: 2130 last night Modified rankin score: 2-Slight disability-UNABLE to perform all activities but does not need assistance IV Thrombolysis: no outside the window  On initial brief exam at the bridge he was noted to have AMS, slurred speech, and less movement on his R side. After he got D50 his neurologic exam then became completely  normal with no focal neurologic deficits and NIHSS = 0   ROS   Comprehensive ROS performed and pertinent positives documented in HPI   Past History   Past Medical History:  Diagnosis Date   Diabetes mellitus without complication (HCC)    Hypertension    Pancreatitis     History reviewed. No pertinent surgical history.  Family History: History reviewed. No pertinent family history.  Social History  reports that he has been smoking cigarettes. He has never used smokeless tobacco. He reports current alcohol use. He reports that he does not currently use drugs.  Allergies  Allergen Reactions   Drug Class [Ace Inhibitors] Swelling    Swelling    Medications   Current Facility-Administered Medications:    amitriptyline  (ELAVIL ) tablet 75 mg, 75 mg, Oral, QHS, Zhang, Ping T, MD, 75 mg at 03/18/24 2205   amLODipine  (NORVASC ) tablet 10 mg, 10 mg, Oral, Daily, Laurita Manor T, MD, 10 mg at 03/18/24 1154   aspirin  chewable tablet 81 mg, 81 mg, Oral, Daily, Laurita Manor T, MD, 81 mg at 03/18/24 1154   atorvastatin  (LIPITOR ) tablet 80 mg, 80 mg, Oral, QHS, Zhang, Ping T, MD, 80 mg at 03/18/24 2205   cyclobenzaprine  (FLEXERIL ) tablet 10 mg, 10 mg, Oral, TID PRN, Zhang, Ping T, MD   fenofibrate  tablet 54 mg, 54 mg, Oral, Daily, Laurita Manor T, MD, 54 mg at 03/18/24 1305   gabapentin  (NEURONTIN ) capsule 800 mg, 800 mg, Oral, TID, Zhang, Ping T, MD, 800 mg at 03/18/24 2205   insulin  aspart (novoLOG )  injection 0-15 Units, 0-15 Units, Subcutaneous, TID WC, Laurita Manor T, MD, 3 Units at Apr 10, 2024 1655   metFORMIN  (GLUCOPHAGE ) tablet 500 mg, 500 mg, Oral, Q breakfast, Laurita Manor T, MD   metoprolol  succinate (TOPROL -XL) 24 hr tablet 50 mg, 50 mg, Oral, Daily, Zhang, Ping T, MD, 50 mg at 10-Apr-2024 1154   ondansetron  (ZOFRAN ) tablet 4 mg, 4 mg, Oral, Q6H PRN **OR** ondansetron  (ZOFRAN ) injection 4 mg, 4 mg, Intravenous, Q6H PRN, Laurita Manor T, MD   oxyCODONE  (Oxy IR/ROXICODONE ) immediate release tablet  5 mg, 5 mg, Oral, Q6H PRN, Laurita Manor T, MD, 5 mg at 03/19/24 0455   pantoprazole  (PROTONIX ) EC tablet 40 mg, 40 mg, Oral, Daily, Laurita Manor T, MD, 40 mg at 2024/04/10 1137  Vitals   Vitals:   04-10-24 2048 04-10-2024 2050 03/19/24 0431 03/19/24 0746  BP: (!) 143/99 (!) 128/90 (!) 131/90 129/85  Pulse: 88 87 88 86  Resp:  18 18 18   Temp: 97.6 F (36.4 C)  97.6 F (36.4 C) 97.7 F (36.5 C)  TempSrc:    Oral  SpO2: 100% 100% 100% 99%  Weight:      Height:        Body mass index is 26.14 kg/m.   Physical Exam   Exam  Vitals:   03/19/24 0431 03/19/24 0746  BP: (!) 131/90 129/85  Pulse: 88 86  Resp: 18 18  Temp: 97.6 F (36.4 C) 97.7 F (36.5 C)  SpO2: 100% 99%    Gen: patient lying in bed, NAD CV: extremities appear well-perfused Resp: normal WOB  Neurologic exam MS: alert, oriented x4, follows commands Speech: no dysarthria, no aphasia CN: PERRL, VFF, EOMI, sensation intact, face symmetric, hearing intact to voice Motor: 5/5 strength throughout Sensory: SILT Reflexes: 1+ symm with toes down bilat Coordination: FNF intact bilat Gait: deferred   Labs/Imaging/Neurodiagnostic studies   CBC:  Recent Labs  Lab 2024-04-10 0932  WBC 6.9  NEUTROABS 4.9  HGB 12.5*  HCT 36.5*  MCV 94.8  PLT 120*   Basic Metabolic Panel:  Lab Results  Component Value Date   NA 138 2024-04-10   K 3.0 (L) 10-Apr-2024   CO2 23 2024/04/10   GLUCOSE 38 (LL) 04-10-24   BUN <5 (L) April 10, 2024   CREATININE 0.87 04-10-2024   CALCIUM  8.0 (L) 2024-04-10   GFRNONAA >60 2024/04/10   GFRAA >60 02/28/2020   Lipid Panel:  Lab Results  Component Value Date   LDLCALC 128 (H) 02/27/2020   HgbA1c:  Lab Results  Component Value Date   HGBA1C 9.3 (H) 02/04/2024   Urine Drug Screen: No results found for: LABOPIA, COCAINSCRNUR, LABBENZ, AMPHETMU, THCU, LABBARB  Alcohol Level     Component Value Date/Time   [facility] <15 04-10-24 0932   INR  Lab Results  Component Value  Date   INR 1.0 10-Apr-2024   APTT  Lab Results  Component Value Date   APTT 27 04-10-2024   AED levels: No results found for: PHENYTOIN, ZONISAMIDE, LAMOTRIGINE, LEVETIRACETA  CT Head without contrast(Personally reviewed): 1. No acute intracranial abnormality. Aspect score is 10. 2. Mild periventricular white matter disease. 3. The above findings were communicated to Dr. Matthews at 9:48 am 04/10/24 via Amion.  ASSESSMENT   Micheal Wong is a 53 y.o. male with hx of diabetes, hypertension, pancreatitis, chronic systolic heart failure with an EF of 40%, hyperlipidemia who presented with AMS, diffuse weakness, And hypoglycemia down to 33. Stroke code was called for acute weakness. After he was  administered D50 his symptoms improved and his neurologic exam normalized so that he had no focal neurologic deficits and NIHSS = 0. Symptoms were explained by his severe hypoglycemia and immediately after being treated for it with D50 he presenting sx also resolved. Etiology of his sx was strongly felt to be hypoglycemia. He has remained neurologically normal since treating with D50 in the ED. Suspicion for stroke is very low and I do not feel that he needs any further inpatient neurology work-up at this time.  RECOMMENDATIONS   - No further inpatient neurology workup recommended at this time. Neurology will remain available prn going forward if any new neurologic concerns arise. ______________________________________________________________________    Signed, Elida CHRISTELLA Ross, MD [facility]

## 2024-03-18 NOTE — ED Notes (Signed)
 Callled  [facility]  cnl  code stroke per  Dr  Matthews  MD

## 2024-03-18 NOTE — Progress Notes (Signed)
   03/18/24 1000  Spiritual Encounters  Type of Visit Initial  Care provided to: Pt and family  Referral source Chaplain assessment  Reason for visit Routine spiritual support  Interventions  Spiritual Care Interventions Made Compassionate presence;Reflective listening  Spiritual Care Plan  Spiritual Care Issues Still Outstanding No further spiritual care needs at this time (see row info)   Chaplain relieved colleague and helped patients friend, Zebedee, try to call patient's sister. Chaplain spoke with patient and he said he was okay. Let him know we are available and ask the nurse if he would like to speak with someone.

## 2024-03-18 NOTE — Progress Notes (Signed)
   03/18/24 0930  Spiritual Encounters  Type of Visit Attempt (pt unavailable)  Care provided to: Family;Pt not available  Conversation partners present during encounter Nurse  Reason for visit Code  OnCall Visit No   Unit Chaplain assisted On-Call Chaplain by responding to the Code Stroke.  Chaplain checked to see if family was present and brought the person back to the patient's room while patient was in CT.  Family friend shared a little history about the patient and their experience together and how he was brought in to the ED.  Chaplain offered a compassionate presence and reflective listening.  Chaplain provided nourishment for the family friend and was relieved by Leisure centre manager.    Rev. Rana M. Nicholaus, M.Div. Chaplain Resident [facility]

## 2024-03-18 NOTE — Progress Notes (Signed)
 CODE STROKE- PHARMACY COMMUNICATION   Time CODE STROKE called/page received: 0926  Time response to CODE STROKE was made (in person or via phone): Immediately  Time Stroke Kit retrieved from Pyxis (only if needed): N/A, patient not a candidate for TNK (out of window)  Name of Provider/Nurse contacted: Dr. Matthews  Past Medical History:  Diagnosis Date   Diabetes mellitus without complication (HCC)    Hypertension    Pancreatitis    Micheal Wong 03/18/2024  9:46 AM

## 2024-03-18 NOTE — ED Notes (Signed)
 Pts BS 33.

## 2024-03-18 NOTE — ED Notes (Signed)
 Date and time results received: 03/18/24 10:34 AM  (use smartphrase .now to insert current time)  Test: Glucose Critical Value: 56  Name of Provider Notified: Dr. Dicky  Orders Received? Or Actions Taken?: D50 given prior

## 2024-03-18 NOTE — ED Provider Notes (Signed)
 [facility] Provider Note    Event Date/Time   First MD Initiated Contact with Patient 03/18/24 [phone]     (approximate)   History   Patient disoriented, aphasic.  EM caveat   HPI  Micheal Wong is a 53 y.o. male  hypertension, prior pancreatitis, recent rib fractures (8, 9, 10, 11 right-sided after fall at home 11/04/2023), heart failure reduced EF 40%, hyperlipidemia, history of syncope, who presented to the ED with acute onset hypoglycemia and associated unresponsiveness during his most recent admission.  Patient presents for concerns of unresponsiveness.  Activated as a potential code stroke as EMS reports noted to have concerns of right-sided weakness.  Last known well was approximately 9:30 PM, and neighbor reported to EMS that she had seen him and he was with her at the apartment at that time.  There will was heard by neighbors a fall or thought this morning.  He was found unresponsive, EMS was activated.  EMS reports blood sugar 78.  Received 2 mg of naloxone  as well for diminished mental status.  No change with naloxone .  On arrival to the ER patient's glucose noted to be 30.  1 amp of D50 administered at 9:35 AM.  Code stroke is already been activated Dr. Matthews at the bedside     Physical Exam   Triage Vital Signs: ED Triage Vitals  Encounter Vitals Group     BP      Girls Systolic BP Percentile      Girls Diastolic BP Percentile      Boys Systolic BP Percentile      Boys Diastolic BP Percentile      Pulse      Resp      Temp      Temp src      SpO2      Weight      Height      Head Circumference      Peak Flow      Pain Score      Pain Loc      Pain Education      Exclude from Growth Chart     Most recent vital signs: Vitals:   03/19/24 0431 03/19/24 0746  BP: (!) 131/90 129/85  Pulse: 88 86  Resp: 18 18  Temp: 97.6 F (36.4 C) 97.7 F (36.5 C)  SpO2: 100% 99%     General: Somnolent but in no acute distress.  Even  unlabored respirations.  Protecting airway well.  Opens eyes to voice, is not interactive however at this time.  Aphasic CV:  Good peripheral perfusion.  Normal tone Resp:  Normal effort.  There are bilateral clear Abd:  No distention.  Other:  Neurologic assessment difficult due to diminished mental status.  Pupils are slightly constricted.  No significant bradypnea.    ED Results / Procedures / Treatments   Labs (all labs ordered are listed, but only abnormal results are displayed) Labs Reviewed  CBC - Abnormal; Notable for the following components:      Result Value   RBC 3.85 (*)    Hemoglobin 12.5 (*)    HCT 36.5 (*)    Platelets 120 (*)    All other components within normal limits  COMPREHENSIVE METABOLIC PANEL WITH GFR - Abnormal; Notable for the following components:   Potassium 3.0 (*)    Glucose, Bld 38 (*)    BUN <5 (*)    Calcium  8.0 (*)    Total Protein  5.9 (*)    Albumin 2.9 (*)    AST 111 (*)    ALT 57 (*)    Alkaline Phosphatase 157 (*)    All other components within normal limits  MAGNESIUM  - Abnormal; Notable for the following components:   Magnesium  1.3 (*)    All other components within normal limits  GLUCOSE, CAPILLARY - Abnormal; Notable for the following components:   Glucose-Capillary 185 (*)    All other components within normal limits  HEPATIC FUNCTION PANEL - Abnormal; Notable for the following components:   Total Protein 5.5 (*)    Albumin 2.7 (*)    AST 72 (*)    ALT 47 (*)    Alkaline Phosphatase 135 (*)    All other components within normal limits  GLUCOSE, CAPILLARY - Abnormal; Notable for the following components:   Glucose-Capillary 128 (*)    All other components within normal limits  CBG MONITORING, ED - Abnormal; Notable for the following components:   Glucose-Capillary 174 (*)    All other components within normal limits  CBG MONITORING, ED - Abnormal; Notable for the following components:   Glucose-Capillary 33 (*)    All other  components within normal limits  PROTIME-INR  APTT  DIFFERENTIAL  ETHANOL  LIPASE, BLOOD  MAGNESIUM   GLUCOSE, CAPILLARY  CBG MONITORING, ED  CBG MONITORING, ED  CBG MONITORING, ED  CBG MONITORING, ED  CBG MONITORING, ED  CBG MONITORING, ED  CBG MONITORING, ED  CBG MONITORING, ED  CBG MONITORING, ED  CBG MONITORING, ED  CBG MONITORING, ED  CBG MONITORING, ED  CBG MONITORING, ED  CBG MONITORING, ED  CBG MONITORING, ED  CBG MONITORING, ED  CBG MONITORING, ED  CBG MONITORING, ED  CBG MONITORING, ED  CBG MONITORING, ED  CBG MONITORING, ED  CBG MONITORING, ED  CBG MONITORING, ED  CBG MONITORING, ED  CBG MONITORING, ED  CBG MONITORING, ED  CBG MONITORING, ED  CBG MONITORING, ED  CBG MONITORING, ED  CBG MONITORING, ED  CBG MONITORING, ED  CBG MONITORING, ED  CBG MONITORING, ED  CBG MONITORING, ED  CBG MONITORING, ED  CBG MONITORING, ED  TROPONIN I (HIGH SENSITIVITY)  TROPONIN I (HIGH SENSITIVITY)     EKG  And interpreted by me at 10 AM heart rate 105 QRS 110 QTc 500 Normal sinus rhythm.  Old anteroseptal infarct.  No evidence of acute ischemia   RADIOLOGY  CT head interpreted by me as grossly negative for acute intracranial hemorrhage  CT Cervical Spine Wo Contrast Result Date: 03/18/2024 EXAM: CT CERVICAL SPINE WITHOUT CONTRAST 03/18/2024 09:48:00 AM TECHNIQUE: CT of the cervical spine was performed without the administration of intravenous contrast. Multiplanar reformatted images are provided for review. Automated exposure control, iterative reconstruction, and/or weight based adjustment of the mA/kV was utilized to reduce the radiation dose to as low as reasonably achievable. COMPARISON: CT of the cervical spine dated 02/04/2024. CLINICAL HISTORY: Facial trauma, blunt. Fall, pt rolled off couch this AM, found down, also code stroke. FINDINGS: CERVICAL SPINE: BONES AND ALIGNMENT: No acute fracture or traumatic malalignment. DEGENERATIVE CHANGES: No significant  degenerative changes. SOFT TISSUES: There is a simple cyst present in the subcutaneous fat of the upper back on the left, measuring approximately 20 x 15 x 16 mm. IMPRESSION: 1. No acute abnormality of the cervical spine related to the reported facial trauma and fall. Electronically signed by: evalene coho 03/18/2024 10:43 AM EDT RP Workstation: [id number]   CT HEAD CODE STROKE WO CONTRAST  Result Date: 03/18/2024 EXAM: CT HEAD WITHOUT 03/18/2024 09:43:00 AM TECHNIQUE: CT of the head was performed without the administration of intravenous contrast. Automated exposure control, iterative reconstruction, and/or weight based adjustment of the mA/kV was utilized to reduce the radiation dose to as low as reasonably achievable. COMPARISON: CT of the head dated February 04, 2024. CLINICAL HISTORY: Neuro deficit, acute, stroke suspected. Code stroke, LWK 9:30pm 8/10, lethargy, right sided weakness; Removed earrings; Dr Matthews [phone] FINDINGS: BRAIN AND VENTRICLES: No acute intracranial hemorrhage. No mass effect or midline shift. No extra-axial fluid collection. Gray-white differentiation is maintained. No hydrocephalus. There is mild periventricular white matter disease present. There is no evidence of acute infarct. [place] stroke program early CT (ASPECT) score: Ganglionic (caudate, IC, lentiform nucleus, insula, M1-M3): 7 Supraganglionic (M4-M6): 3 Total: 10 ORBITS: No acute abnormality. SINUSES AND MASTOIDS: No acute abnormality. SOFT TISSUES AND SKULL: No acute skull fracture. No acute soft tissue abnormality. IMPRESSION: 1. No acute intracranial abnormality. Aspect score is 10. 2. Mild periventricular white matter disease. 3. The above findings were communicated to Dr. Matthews at 9:48 am 03/18/2024 via Amion. Electronically signed by: evalene coho 03/18/2024 09:52 AM EDT RP Workstation: [id number]      PROCEDURES:  Critical Care performed: Yes, see critical care procedure note(s)  CRITICAL  CARE Performed by: Oneil Budge   Total critical care time: 35 minutes  Critical care time was exclusive of separately billable procedures and treating other patients.  Critical care was necessary to treat or prevent imminent or life-threatening deterioration.  Critical care was time spent personally by me on the following activities: development of treatment plan with patient and/or surrogate as well as nursing, discussions with consultants, evaluation of patient's response to treatment, examination of patient, obtaining history from patient or surrogate, ordering and performing treatments and interventions, ordering and review of laboratory studies, ordering and review of radiographic studies, pulse oximetry and re-evaluation of patient's condition.   Procedures   MEDICATIONS ORDERED IN ED: Medications  aspirin  chewable tablet 81 mg (81 mg Oral Given 03/19/24 0849)  oxyCODONE  (Oxy IR/ROXICODONE ) immediate release tablet 5 mg (5 mg Oral Given 03/19/24 0455)  amLODipine  (NORVASC ) tablet 10 mg (10 mg Oral Given 03/19/24 0849)  atorvastatin  (LIPITOR ) tablet 80 mg (80 mg Oral Given 03/18/24 2205)  fenofibrate  tablet 54 mg (54 mg Oral Given 03/19/24 0849)  metoprolol  succinate (TOPROL -XL) 24 hr tablet 50 mg (50 mg Oral Given 03/19/24 0849)  amitriptyline  (ELAVIL ) tablet 75 mg (75 mg Oral Given 03/18/24 2205)  metFORMIN  (GLUCOPHAGE ) tablet 500 mg (500 mg Oral Given 03/19/24 0849)  pantoprazole  (PROTONIX ) EC tablet 40 mg (40 mg Oral Given 03/19/24 0849)  cyclobenzaprine  (FLEXERIL ) tablet 10 mg (has no administration in time range)  gabapentin  (NEURONTIN ) capsule 800 mg (800 mg Oral Given 03/19/24 0850)  ondansetron  (ZOFRAN ) tablet 4 mg (has no administration in time range)    Or  ondansetron  (ZOFRAN ) injection 4 mg (has no administration in time range)  insulin  aspart (novoLOG ) injection 0-15 Units ( Subcutaneous Not Given 03/19/24 0750)  sodium chloride  flush (NS) 0.9 % injection 3 mL (3 mLs  Intravenous Given 03/18/24 1025)  thiamine  (VITAMIN B1) injection 100 mg (100 mg Intravenous Given 03/18/24 1022)  dextrose  50 % solution 25 g (25 g Intravenous Given 03/18/24 0935)  potassium chloride  SA (KLOR-CON  M) CR tablet 40 mEq (40 mEq Oral Given 03/18/24 1305)  magnesium  sulfate IVPB 4 g 100 mL (4 g Intravenous New Bag/Given 03/18/24 1341)  ketorolac  (TORADOL ) 30 MG/ML injection 30  mg (30 mg Intravenous Given 03/18/24 1656)     IMPRESSION / MDM / ASSESSMENT AND PLAN / ED COURSE  I reviewed the triage vital signs and the nursing notes.                              Differential diagnosis includes, but is not limited to, hypoglycemia, metabolic abnormality encephalopathy, central cause such as stroke intracranial hemorrhage etc. are all considered.  I does a previous to admission for decreased mental status with hypoglycemia.  Hypoglycemia treated with D50 on arrival to the ER, accompanied to CT for further neurology consult with Dr. Matthews as code stroke activated prehospital    Patient's presentation is most consistent with acute presentation with potential threat to life or bodily function.      Clinical Course as of 03/19/24 1001  Mon Mar 18, 2024  9049 CT head inter by me as grossly negative for acute intracranial hemorrhage [MQ]  1001 Patient now alert fully oriented, conversant.  Advises to use Lantus  30 units last night ate normally as to his typical diet yesterday. [MQ]  1001 Patient moves all extremities well, 5 out of 5 strength in upper extremities clear speech fully oriented conversant with his neighbor who came to visit him [MQ]  1003 Back to normal mentation with reassuring exam. Not a thrombolytic candidate. Cancel code stroke per Dr. Matthews [MQ]  [phone] Consulted with and patient accepted to hospitalist by Dr. Laurita.  Patient understanding agreeable with plan for admission [MQ]    Clinical Course User Index [MQ] Dicky Anes, MD   Patient admitted to hospital service,  condition much improved.  FINAL CLINICAL IMPRESSION(S) / ED DIAGNOSES   Final diagnoses:  Hypoglycemia     Rx / DC Orders   ED Discharge Orders     None        Note:  This document was prepared using Dragon voice recognition software and may include unintentional dictation errors.   Dicky Anes, MD 03/19/24 1001

## 2024-03-19 ENCOUNTER — Other Ambulatory Visit (HOSPITAL_COMMUNITY): Payer: Self-pay

## 2024-03-19 DIAGNOSIS — E162 Hypoglycemia, unspecified: Secondary | ICD-10-CM | POA: Diagnosis not present

## 2024-03-19 LAB — GLUCOSE, CAPILLARY
Glucose-Capillary: 98 mg/dL (ref 70–99)
Glucose-Capillary: 245 mg/dL — ABNORMAL HIGH (ref 70–99)
Glucose-Capillary: 191 mg/dL — ABNORMAL HIGH (ref 70–99)
Glucose-Capillary: 167 mg/dL — ABNORMAL HIGH (ref 70–99)

## 2024-03-19 LAB — HEPATIC FUNCTION PANEL
ALT: 47 U/L — ABNORMAL HIGH (ref 0–44)
AST: 72 U/L — ABNORMAL HIGH (ref 15–41)
Albumin: 2.7 g/dL — ABNORMAL LOW (ref 3.5–5.0)
Alkaline Phosphatase: 135 U/L — ABNORMAL HIGH (ref 38–126)
Bilirubin, Direct: 0.2 mg/dL (ref 0.0–0.2)
Indirect Bilirubin: 0.8 mg/dL (ref 0.3–0.9)
Total Bilirubin: 1 mg/dL (ref 0.0–1.2)
Total Protein: 5.5 g/dL — ABNORMAL LOW (ref 6.5–8.1)

## 2024-03-19 LAB — MAGNESIUM: Magnesium: 1.9 mg/dL (ref 1.7–2.4)

## 2024-03-19 LAB — CK: Total CK: 164 U/L (ref 49–397)

## 2024-03-19 MED ORDER — INSULIN ASPART 100 UNIT/ML IJ SOLN
5.0000 [IU] | Freq: Three times a day (TID) | INTRAMUSCULAR | Status: DC
  Administered 2024-03-19 – 2024-03-20 (×4): 5 [IU] via SUBCUTANEOUS
  Filled 2024-03-19 (×2): qty 1

## 2024-03-19 MED ORDER — OXYCODONE HCL 5 MG PO TABS
5.0000 mg | ORAL_TABLET | Freq: Four times a day (QID) | ORAL | Status: DC | PRN
  Administered 2024-03-19 – 2024-03-20 (×4): 5 mg via ORAL
  Filled 2024-03-19 (×2): qty 1

## 2024-03-19 NOTE — Inpatient Diabetes Management (Signed)
 Inpatient Diabetes Program Recommendations  AACE/ADA: New Consensus Statement on Inpatient Glycemic Control (2015)  Target Ranges:  Prepandial:   less than 140 mg/dL      Peak postprandial:   less than 180 mg/dL (1-2 hours)      Critically ill patients:  140 - 180 mg/dL   Lab Results  Component Value Date   GLUCAP 98 03/19/2024   HGBA1C 9.3 (H) 02/04/2024    Latest Reference Range & Units 03/18/24 09:30 03/18/24 09:46 03/18/24 11:40 03/18/24 16:37 03/18/24 20:44 03/19/24 07:43  Glucose-Capillary 70 - 99 mg/dL 33 (LL) 825 (H) 97 814 (H) 128 (H) 98  (LL): Data is critically low (H): Data is abnormally high  Diabetes history: DM2 Outpatient Diabetes medications: Lantus  30 units daily Humalog  12 units tid meal coverage Metformin  1 gm bid Current orders for Inpatient glycemic control: Metformin  500 mg daily Novolog  0-15 units tid  Inpatient Diabetes Program Recommendations:   Spoke with patient @ bedside. DM coordinator spoke with patient on 02/04/24 regarding hypoglycemia but patient was not interested in a CGM @ this time.  Patient's insurance is not showing med coverage but reviewed with patient Herlene 3 available for $75 per month without coverage.  Patient has been taking Humalog  12 units tid meal coverage regardless of how large a meal he eats and he has been having hypoglycemia. Patient just got a new glucose meter so was not able to check CBGs for ? a while. Reviewed with patient to hold meal coverage if not eating and if CBG is 80 or below. 2 hours post meal should be <180. Patient verbalized hypoglycemia protocol and keeps glucose tablets with him @ all times. Reviewed with pt. Need to followup with his physician and review CBGs. Patient shared he cannot afford Jardiance  or Januvia. Patient would like endocrinologist referral.   MD ordered application of Freestyle CGM at discharge for patient. Education done regarding application and changing CGM sensor (alternate every 15 days on  back of arms), 1 hour warm-up, use of glucometer when alert displays, how to scan CGM for glucose reading and information for PCP. Patient has also been given educational packet regarding use CGM sensor including the 1-800 toll free number for any questions, problems or needs related to the [facility] sensors or reader.    Sensor applied by patient to (L) Arm at 9:00 am.  Explained that glucose readings will not be available until 1 hour after application. Reviewed use of CGM including how to scan, changing Sensor, Vitamin C warning, arrows with glucose readings, and Freestyle app. Patient very appreciative.   Thank you, Jailyne Chieffo E. Radames Mejorado, RN, MSN, CDCES  Diabetes Coordinator Inpatient Glycemic Control Team Team Pager [phone] (8am-5pm) 03/19/2024 9:50 AM

## 2024-03-19 NOTE — Hospital Course (Addendum)
 Hospital course / significant events:   HPI: Micheal Wong is a 53 y.o. male with medical history significant of IDDM, hypertension, pancreatitis, recent rib fracture, chronic HFrEF w/ LVEF 40%, HLD. Patient was recently hospitalized for hypoglycemia due to inappropriate dose of insulin  especially Lantus , patient was instructed to cut off Lantus  and reduce Humalog , however did not follow instruction, still uses the same 3 unit of Lantus  daily at night and 15 units of Humalog  before each meal. Lately he has had frequent episodes of fatigue, but he told me that due to insurance issue he did not use any glucometer to check his fingerstick. Patient was found unresponsive today at work and EMS also called in. EMS found the patient appeared to have weakness of right side and code stroke was called, glucose in the field was 78, patient also received 2 mg of Narcan .   Reviewed recent DC summary 02/06/24- pt was declining diabetes education and insisting on no medication changes despite admission for hypoglycemia. No apparent follow up with PCP since that admission  08/11: to ED, code stroke cancelled. Tachycardic, hypotensive. CR head neg. Glc 30. Given D50 in ED. Insulin  held.  08/12: diabetes educator and hospitalist confirmed insulin  he is using at home - Lantus  30 Units daily, Humalog  12 units tid but occasionally not eating much. We started him on continuous Glc monitor. Reduced meal time insulin  to 5 units. Glc elevated today likely d/t no long acting insulin  administered yesterday. Will give this evening and if Glc remains appropriate can dc tomorrow after breakfast      Consultants:  Neurology   Procedures/Surgeries: none      ASSESSMENT & PLAN:   Hypoglycemia, improving Secondary to inappropriately insulin  dosage especially Lantus  Type 2 diabetes - recent A1C 02/04/24 was 9.3 Discontinue Lantus , patient agreed Recommend patient checks fingerstick 3 times a day for 1 week and keep a record  to show to PCP in 1 week, patient agreed Hold off standing dose of Humalog , start SSI, will decide Humalog  dosage for patient to go home Hold off Jardiance  today, continue metformin  and Januvia Diabetic coordinator consulted   Hypokalemia Likely some component of intracellular shift d/t insulin   P.o. replacement Magnesium  pending   HTN, uncontrolled Chronic HFrEF Euvolemic Continue home BP meds    Muscle pain Pt reports problems w/ falling and feeling unsteady CK normal no rhabdo Oxycodone  prn, pt educated he will not receive IV pain Rx  PT/OT  HLD Continue statin   Alcohol abuse History of pancreatitis Last used 08/10, no symptoms or signs of acute withdrawal No abdominal pain lipase level normal Counseled on EtOH abstinence    overweight based on BMI: Body mass index is 26.14 kg/m.SABRA Significantly low or high BMI is associated with higher medical risk.  Underweight - under 18  overweight - 25 to 29 obese - 30 or more Class 1 obesity: BMI of 30.0 to 34 Class 2 obesity: BMI of 35.0 to 39 Class 3 obesity: BMI of 40.0 to 49 Super Morbid Obesity: BMI 50-59 Super-super Morbid Obesity: BMI 60+ Healthy nutrition and physical activity advised as adjunct to other disease management and risk reduction treatments    DVT prophylaxis: ambulation IV fluids: no continuous IV fluids  Nutrition: carb modified diet  Central lines / other devices: none  Code Status: FULL CODE ACP documentation reviewed:  none on file in [facility]  TOC needs: TBD pend PT/OT eval Medical barriers to dispo: glucose control. Expected medical readiness for discharge tomorrow early morning.

## 2024-03-19 NOTE — Progress Notes (Signed)
 PROGRESS NOTE    Micheal Wong   FMW:[id number] DOB: [birth year]  DOA: 03/18/2024 Date of Service: 03/19/24 which is hospital day 0  PCP: Dyane Carlyon Norris, [facility] course / significant events:   HPI: Micheal Wong is a 53 y.o. male with medical history significant of IDDM, hypertension, pancreatitis, recent rib fracture, chronic HFrEF w/ LVEF 40%, HLD. Patient was recently hospitalized for hypoglycemia due to inappropriate dose of insulin  especially Lantus , patient was instructed to cut off Lantus  and reduce Humalog , however did not follow instruction, still uses the same 3 unit of Lantus  daily at night and 15 units of Humalog  before each meal. Lately he has had frequent episodes of fatigue, but he told me that due to insurance issue he did not use any glucometer to check his fingerstick. Patient was found unresponsive today at work and EMS also called in. EMS found the patient appeared to have weakness of right side and code stroke was called, glucose in the field was 78, patient also received 2 mg of Narcan .   Reviewed recent DC summary 02/06/24- pt was declining diabetes education and insisting on no medication changes despite admission for hypoglycemia. No apparent follow up with PCP since that admission  08/11: to ED, code stroke cancelled. Tachycardic, hypotensive. CR head neg. Glc 30. Given D50 in ED. Insulin  held.  08/12: diabetes educator and hospitalist confirmed insulin  he is using at home - Lantus  30 Units daily, Humalog  12 units tid but occasionally not eating much. We started him on continuous Glc monitor. Reduced meal time insulin  to 5 units. Glc elevated today likely d/t no long acting insulin  administered yesterday. Will give this evening and if Glc remains appropriate can dc tomorrow after breakfast      Consultants:  Neurology   Procedures/Surgeries: none      ASSESSMENT & PLAN:   Hypoglycemia, improving Secondary to inappropriately insulin  dosage  especially Lantus  Type 2 diabetes - recent A1C 02/04/24 was 9.3 Discontinue Lantus , patient agreed Recommend patient checks fingerstick 3 times a day for 1 week and keep a record to show to PCP in 1 week, patient agreed Hold off standing dose of Humalog , start SSI, will decide Humalog  dosage for patient to go home Hold off Jardiance  today, continue metformin  and Januvia Diabetic coordinator consulted   Hypokalemia Likely some component of intracellular shift d/t insulin   P.o. replacement Magnesium  pending   HTN, uncontrolled Chronic HFrEF Euvolemic Continue home BP meds    Muscle pain Pt reports problems w/ falling and feeling unsteady CK normal no rhabdo Oxycodone  prn, pt educated he will not receive IV pain Rx  PT/OT  HLD Continue statin   Alcohol abuse History of pancreatitis Last used 08/10, no symptoms or signs of acute withdrawal No abdominal pain lipase level normal Counseled on EtOH abstinence    overweight based on BMI: Body mass index is 26.14 kg/m.SABRA Significantly low or high BMI is associated with higher medical risk.  Underweight - under 18  overweight - 25 to 29 obese - 30 or more Class 1 obesity: BMI of 30.0 to 34 Class 2 obesity: BMI of 35.0 to 39 Class 3 obesity: BMI of 40.0 to 49 Super Morbid Obesity: BMI 50-59 Super-super Morbid Obesity: BMI 60+ Healthy nutrition and physical activity advised as adjunct to other disease management and risk reduction treatments    DVT prophylaxis: ambulation IV fluids: no continuous IV fluids  Nutrition: carb modified diet  Central lines / other devices: none  Code Status:  FULL CODE ACP documentation reviewed:  none on file in [facility]  TOC needs: TBD pend PT/OT eval Medical barriers to dispo: glucose control. Expected medical readiness for discharge tomorrow early morning.              Subjective / Brief ROS:  Patient reports muscle soreness everywhere which he attributes to falling at home and  reports unsteadiness of gait all the time Denies CP/SOB.  Pain controlled.  Denies new weakness.  Tolerating diet.  Reports no concerns w/ urination/defecation.   Family Communication: none at this time     Objective Findings:  Vitals:   03/19/24 0431 03/19/24 0746 03/19/24 1214 03/19/24 1557  BP: (!) 131/90 129/85 (!) 127/90 120/80  Pulse: 88 86 86 83  Resp: 18 18 16 18   Temp: 97.6 F (36.4 C) 97.7 F (36.5 C) 98 F (36.7 C) 98.4 F (36.9 C)  TempSrc:  Oral    SpO2: 100% 99% 100% 100%  Weight:      Height:        Intake/Output Summary (Last 24 hours) at 03/19/2024 1627 Last data filed at 03/19/2024 1345 Gross per 24 hour  Intake 940 ml  Output --  Net 940 ml   Filed Weights   03/18/24 1223  Weight: 80.3 kg    Examination:  Physical Exam Constitutional:      General: He is not in acute distress.    Appearance: He is not ill-appearing.  Cardiovascular:     Rate and Rhythm: Normal rate and regular rhythm.  Pulmonary:     Effort: Pulmonary effort is normal.     Breath sounds: Normal breath sounds.  Abdominal:     Palpations: Abdomen is soft.  Musculoskeletal:     Right lower leg: No edema.     Left lower leg: No edema.  Neurological:     General: No focal deficit present.     Mental Status: He is alert and oriented to person, place, and time.          Scheduled Medications:   amitriptyline   75 mg Oral QHS   amLODipine   10 mg Oral Daily   aspirin   81 mg Oral Daily   atorvastatin   80 mg Oral QHS   fenofibrate   54 mg Oral Daily   gabapentin   800 mg Oral TID   insulin  aspart  5 Units Subcutaneous TID WC   metFORMIN   500 mg Oral Q breakfast   metoprolol  succinate  50 mg Oral Daily   pantoprazole   40 mg Oral Daily    Continuous Infusions:   PRN Medications:  cyclobenzaprine , ondansetron  **OR** ondansetron  (ZOFRAN ) IV, oxyCODONE   Antimicrobials from admission:  Anti-infectives (From admission, onward)    None           Data  Reviewed:  I have personally reviewed the following...  CBC: Recent Labs  Lab 03/18/24 0932  WBC 6.9  NEUTROABS 4.9  HGB 12.5*  HCT 36.5*  MCV 94.8  PLT 120*   Basic Metabolic Panel: Recent Labs  Lab 03/18/24 0932 03/18/24 1143 03/19/24 0438  NA 138  --   --   K 3.0*  --   --   CL 102  --   --   CO2 23  --   --   GLUCOSE 38*  --   --   BUN <5*  --   --   CREATININE 0.87  --   --   CALCIUM  8.0*  --   --   MG  --  1.3* 1.9   GFR: Estimated Creatinine Clearance: 99.3 mL/min (by C-G formula based on SCr of 0.87 mg/dL). Liver Function Tests: Recent Labs  Lab 03/18/24 0932 03/19/24 0438  AST 111* 72*  ALT 57* 47*  ALKPHOS 157* 135*  BILITOT 1.0 1.0  PROT 5.9* 5.5*  ALBUMIN 2.9* 2.7*   Recent Labs  Lab 03/18/24 0932  LIPASE 20   No results for input(s): AMMONIA in the last 168 hours. Coagulation Profile: Recent Labs  Lab 03/18/24 0932  INR 1.0   Cardiac Enzymes: Recent Labs  Lab 03/19/24 1308  CKTOTAL 164   BNP (last 3 results) No results for input(s): PROBNP in the last 8760 hours. HbA1C: No results for input(s): HGBA1C in the last 72 hours. CBG: Recent Labs  Lab 03/18/24 1140 03/18/24 1637 03/18/24 2044 03/19/24 0743 03/19/24 1217  GLUCAP 97 185* 128* 98 245*   Lipid Profile: No results for input(s): CHOL, HDL, LDLCALC, TRIG, CHOLHDL, LDLDIRECT in the last 72 hours. Thyroid Function Tests: No results for input(s): TSH, T4TOTAL, FREET4, T3FREE, THYROIDAB in the last 72 hours. Anemia Panel: No results for input(s): VITAMINB12, FOLATE, FERRITIN, TIBC, IRON, RETICCTPCT in the last 72 hours. Most Recent Urinalysis On File:     Component Value Date/Time   COLORURINE STRAW (A) 10/26/2020 1816   APPEARANCEUR CLEAR (A) 10/26/2020 1816   LABSPEC >1.046 (H) 10/26/2020 1816   PHURINE 5.0 10/26/2020 1816   GLUCOSEU >=500 (A) 10/26/2020 1816   HGBUR SMALL (A) 10/26/2020 1816   BILIRUBINUR NEGATIVE  10/26/2020 1816   KETONESUR 20 (A) 10/26/2020 1816   PROTEINUR NEGATIVE 10/26/2020 1816   NITRITE NEGATIVE 10/26/2020 1816   LEUKOCYTESUR NEGATIVE 10/26/2020 1816   Sepsis Labs: @LABRCNTIP (procalcitonin:4,lacticidven:4) Microbiology: No results found for this or any previous visit (from the past 240 hours).    Radiology Studies last 3 days: CT Cervical Spine Wo Contrast Result Date: 03/18/2024 EXAM: CT CERVICAL SPINE WITHOUT CONTRAST 03/18/2024 09:48:00 AM TECHNIQUE: CT of the cervical spine was performed without the administration of intravenous contrast. Multiplanar reformatted images are provided for review. Automated exposure control, iterative reconstruction, and/or weight based adjustment of the mA/kV was utilized to reduce the radiation dose to as low as reasonably achievable. COMPARISON: CT of the cervical spine dated 02/04/2024. CLINICAL HISTORY: Facial trauma, blunt. Fall, pt rolled off couch this AM, found down, also code stroke. FINDINGS: CERVICAL SPINE: BONES AND ALIGNMENT: No acute fracture or traumatic malalignment. DEGENERATIVE CHANGES: No significant degenerative changes. SOFT TISSUES: There is a simple cyst present in the subcutaneous fat of the upper back on the left, measuring approximately 20 x 15 x 16 mm. IMPRESSION: 1. No acute abnormality of the cervical spine related to the reported facial trauma and fall. Electronically signed by: evalene coho 03/18/2024 10:43 AM EDT RP Workstation: [id number]   CT HEAD CODE STROKE WO CONTRAST Result Date: 03/18/2024 EXAM: CT HEAD WITHOUT 03/18/2024 09:43:00 AM TECHNIQUE: CT of the head was performed without the administration of intravenous contrast. Automated exposure control, iterative reconstruction, and/or weight based adjustment of the mA/kV was utilized to reduce the radiation dose to as low as reasonably achievable. COMPARISON: CT of the head dated February 04, 2024. CLINICAL HISTORY: Neuro deficit, acute, stroke suspected. Code  stroke, LWK 9:30pm 8/10, lethargy, right sided weakness; Removed earrings; Dr Matthews [phone] FINDINGS: BRAIN AND VENTRICLES: No acute intracranial hemorrhage. No mass effect or midline shift. No extra-axial fluid collection. Gray-white differentiation is maintained. No hydrocephalus. There is mild periventricular white matter disease present. There is no  evidence of acute infarct. [place] stroke program early CT (ASPECT) score: Ganglionic (caudate, IC, lentiform nucleus, insula, M1-M3): 7 Supraganglionic (M4-M6): 3 Total: 10 ORBITS: No acute abnormality. SINUSES AND MASTOIDS: No acute abnormality. SOFT TISSUES AND SKULL: No acute skull fracture. No acute soft tissue abnormality. IMPRESSION: 1. No acute intracranial abnormality. Aspect score is 10. 2. Mild periventricular white matter disease. 3. The above findings were communicated to Dr. Matthews at 9:48 am 03/18/2024 via Amion. Electronically signed by: evalene coho 03/18/2024 09:52 AM EDT RP Workstation: [id number]         Laneta Blunt, DO [facility] 03/19/2024, 4:27 PM    Dictation software may have been used to generate the above note. Typos may occur and escape review in typed/dictated notes. Please contact Dr Blunt directly for clarity if needed.  Staff may message me via secure chat in Epic  but this may not receive an immediate response,  please page me for urgent matters!  If 7PM-7AM, please contact night coverage [email or web address]

## 2024-03-19 NOTE — TOC Initial Note (Signed)
 Transition of Care [facility]) - Initial/Assessment Note    Patient Details  Name: Micheal Wong MRN: [id number] Date of Birth: 1970-12-02  Transition of Care [facility] CM/SW Contact:    Dalia GORMAN Fuse, RN Phone Number: 03/19/2024, 7:30 AM  Clinical Narrative:                  Patient is from home and admitted for hypoglycemia and hypokalemia. Lantus  discontinued, holding standing humalog  and jardiance , and DM coordinator consulted. No TOC needs , outreach if TOC needs are identified.       Patient Goals and CMS Choice            Expected Discharge Plan and Services                                              Prior Living Arrangements/Services                       Activities of Daily Living   ADL Screening (condition at time of admission) Independently performs ADLs?: Yes (appropriate for developmental age) Is the patient deaf or have difficulty hearing?: No Does the patient have difficulty seeing, even when wearing glasses/contacts?: No Does the patient have difficulty concentrating, remembering, or making decisions?: No  Permission Sought/Granted                  Emotional Assessment              Admission diagnosis:  Hypoglycemia [E16.2] Patient Active Problem List   Diagnosis Date Noted   Anal lesion 02/05/2024   Hypoglycemia 02/04/2024   Dyslipidemia 02/04/2024   Type 2 diabetes mellitus with peripheral neuropathy (HCC) 02/04/2024   Hyperglycemia    Atrial fibrillation with rapid ventricular response (HCC)    Acute alcoholic pancreatitis 10/26/2020   Essential hypertension    Pancreatitis 02/27/2020   Type 2 diabetes mellitus with hyperlipidemia (HCC) 02/27/2020   Hyperlipemia 02/27/2020   Dehydration 02/27/2020   Pancreatitis, recurrent 02/27/2020   ETOH abuse 02/27/2020   Tobacco abuse 02/27/2020   Hypokalemia 02/27/2020   Hyponatremia 02/27/2020   PCP:  Dyane Carlyon Norris, FNP Pharmacy:   [facility]  [id number] [address], [place] - [address] AT [facility] [address] [place] [address] Phone: [phone] Fax: [phone]  [facility] [address]), [place] - [address] [place]) [place] [address] Phone: [phone] Fax: [phone]     Social Drivers of Health (SDOH) Social History: SDOH Screenings   Food Insecurity: No Food Insecurity (03/18/2024)  Housing: High Risk (03/18/2024)  Transportation Needs: Unmet Transportation Needs (03/18/2024)  Utilities: Not At Risk (03/18/2024)  Financial Resource Strain: Medium Risk (08/03/2023)   Received from [facility]  Physical Activity: Inactive (08/03/2023)   Received from [facility]  Social Connections: Moderately Isolated (03/18/2024)  Stress: Stress Concern Present (08/03/2023)   Received from [facility]  Tobacco Use: High Risk (03/18/2024)  Health Literacy: Adequate Health Literacy (08/03/2023)   Received from [facility]   SDOH Interventions:     Readmission Risk Interventions     No data to display

## 2024-03-20 DIAGNOSIS — E162 Hypoglycemia, unspecified: Secondary | ICD-10-CM | POA: Diagnosis not present

## 2024-03-20 LAB — CBC
HCT: 35.7 % — ABNORMAL LOW (ref 39.0–52.0)
Hemoglobin: 12.1 g/dL — ABNORMAL LOW (ref 13.0–17.0)
MCH: 32.4 pg (ref 26.0–34.0)
MCHC: 33.9 g/dL (ref 30.0–36.0)
MCV: 95.7 fL (ref 80.0–100.0)
Platelets: 118 K/uL — ABNORMAL LOW (ref 150–400)
RBC: 3.73 MIL/uL — ABNORMAL LOW (ref 4.22–5.81)
RDW: 13.3 % (ref 11.5–15.5)
WBC: 5.3 K/uL (ref 4.0–10.5)
nRBC: 0 % (ref 0.0–0.2)

## 2024-03-20 LAB — BASIC METABOLIC PANEL WITH GFR
Anion gap: 6 (ref 5–15)
Anion gap: 8 (ref 5–15)
BUN: <5 mg/dL — ABNORMAL LOW (ref 6–20)
BUN: <5 mg/dL — ABNORMAL LOW (ref 6–20)
CO2: 27 mmol/L (ref 22–32)
CO2: 27 mmol/L (ref 22–32)
Calcium: 8.5 mg/dL — ABNORMAL LOW (ref 8.9–10.3)
Calcium: 8.7 mg/dL — ABNORMAL LOW (ref 8.9–10.3)
Chloride: 103 mmol/L (ref 98–111)
Chloride: 103 mmol/L (ref 98–111)
Creatinine, Ser: 0.91 mg/dL (ref 0.61–1.24)
Creatinine, Ser: 0.81 mg/dL (ref 0.61–1.24)
GFR, Estimated: >60 mL/min (ref >60)
GFR, Estimated: >60 mL/min (ref >60)
Glucose, Bld: 188 mg/dL — ABNORMAL HIGH (ref 70–99)
Glucose, Bld: 115 mg/dL — ABNORMAL HIGH (ref 70–99)
Potassium: 5.5 mmol/L — ABNORMAL HIGH (ref 3.5–5.1)
Potassium: 4.4 mmol/L (ref 3.5–5.1)
Sodium: 136 mmol/L (ref 135–145)
Sodium: 138 mmol/L (ref 135–145)

## 2024-03-20 LAB — GLUCOSE, CAPILLARY: Glucose-Capillary: 175 mg/dL — ABNORMAL HIGH (ref 70–99)

## 2024-03-20 MED ORDER — OXYCODONE HCL 5 MG PO TABS: 5.0000 mg | ORAL_TABLET | Freq: Three times a day (TID) | ORAL | 0 refills | Status: AC | PRN

## 2024-03-20 MED ORDER — FENOFIBRATE 54 MG PO TABS: 54.0000 mg | ORAL_TABLET | Freq: Every day | ORAL | 0 refills | Status: AC

## 2024-03-20 MED ORDER — ATORVASTATIN CALCIUM 80 MG PO TABS: 80.0000 mg | ORAL_TABLET | Freq: Every day | ORAL | 0 refills | Status: AC

## 2024-03-20 MED ORDER — ASPIRIN 81 MG PO TBEC
81.0000 mg | DELAYED_RELEASE_TABLET | Freq: Every day | ORAL | Status: DC
  Administered 2024-03-20 (×2): 81 mg via ORAL
  Filled 2024-03-20: qty 1

## 2024-03-20 MED ORDER — HUMALOG KWIKPEN 100 UNIT/ML ~~LOC~~ SOPN: 5.0000 [IU] | PEN_INJECTOR | Freq: Three times a day (TID) | SUBCUTANEOUS | 0 refills | Status: AC

## 2024-03-20 MED ORDER — EMPAGLIFLOZIN 25 MG PO TABS: 25.0000 mg | ORAL_TABLET | Freq: Every day | ORAL | 0 refills | Status: AC

## 2024-03-20 NOTE — Discharge Summary (Signed)
 Physician Discharge Summary  Patient: Micheal Wong FMW:[id number] DOB: 20-Jul-1971   Code Status: Full Code Admit date: 03/18/2024 Discharge date: 03/20/2024 Disposition: Home, No home health services recommended PCP: Dyane Carlyon Norris, FNP  Recommendations for Outpatient Follow-up:  Follow up with PCP within 1-2 weeks Regarding general hospital follow up and preventative care Recommend monitoring blood sugars  Discharge Diagnoses:  Principal Problem:   Hypoglycemia  Brief Hospital Course Summary: Micheal Wong is a 53 y.o. male with medical history significant of IDDM, hypertension, pancreatitis, recent rib fracture, chronic HFrEF w/ LVEF 40%, HLD. They presented with hypoglycemia likely due to dose of insulin  too high.  Originally concern for stroke but was ruled out.  His symptoms resolved with correction of his glucose levels.   His insulin  was stopped at dc. The diabetes coordinator was consulted and provided with a continuous glucose monitor. Glucose checks the day leading up to dc were 188>175>115. Continued on metformin , jaurdiance.   All other chronic conditions were treated with home medications.    Discharge Condition: Good, improved Recommended discharge diet: Diabetic diet  Consultations: None   Procedures/Studies: None   Discharge Instructions     Ambulatory referral to Endocrinology   Complete by: As directed       Allergies as of 03/20/2024       Reactions   Drug Class [ace Inhibitors] Swelling   Swelling        Medication List     STOP taking these medications    insulin  glargine 100 UNIT/ML injection Commonly known as: LANTUS    losartan  50 MG tablet Commonly known as: COZAAR    meloxicam 15 MG tablet Commonly known as: MOBIC   mupirocin  ointment 2 % Commonly known as: BACTROBAN    nystatin ointment Commonly known as: MYCOSTATIN   thiamine  100 MG tablet Commonly known as: VITAMIN B1   triamcinolone  ointment 0.5 % Commonly  known as: KENALOG        TAKE these medications    amitriptyline  75 MG tablet Commonly known as: ELAVIL  Take 75 mg by mouth at bedtime.   amLODipine  10 MG tablet Commonly known as: NORVASC  Take 10 mg by mouth daily.   aspirin  81 MG chewable tablet Chew 81 mg by mouth daily.   atorvastatin  80 MG tablet Commonly known as: LIPITOR  Take 80 mg by mouth daily. What changed: Another medication with the same name was changed. Make sure you understand how and when to take each.   atorvastatin  80 MG tablet Commonly known as: LIPITOR  Take 1 tablet (80 mg total) by mouth at bedtime. What changed:  medication strength how much to take when to take this additional instructions   cyclobenzaprine  10 MG tablet Commonly known as: FLEXERIL  Take 10 mg by mouth 3 (three) times daily as needed.   empagliflozin  25 MG Tabs tablet Commonly known as: JARDIANCE  Take 1 tablet (25 mg total) by mouth daily.   fenofibrate  54 MG tablet Take 1 tablet (54 mg total) by mouth daily. Start taking on: March 21, 2024 What changed:  medication strength how much to take additional instructions   gabapentin  800 MG tablet Commonly known as: NEURONTIN  Take 800 mg by mouth 3 (three) times daily.   HumaLOG  KwikPen 100 UNIT/ML KwikPen Generic drug: insulin  lispro Inject 5 Units into the skin 3 (three) times daily. What changed: how much to take   metFORMIN  500 MG tablet Commonly known as: GLUCOPHAGE  Take 500 mg by mouth daily with breakfast. Not sure of the dose   metoprolol   succinate 50 MG 24 hr tablet Commonly known as: TOPROL -XL Take 50 mg by mouth daily.   omeprazole 20 MG capsule Commonly known as: PRILOSEC Take 20 mg by mouth daily.   oxyCODONE  5 MG immediate release tablet Commonly known as: Oxy IR/ROXICODONE  Take 1 tablet (5 mg total) by mouth every 8 (eight) hours as needed for up to 3 days for severe pain (pain score 7-10). What changed: when to take this   sitaGLIPtin 100 MG  tablet Commonly known as: JANUVIA Take 100 mg by mouth daily.   spironolactone 25 MG tablet Commonly known as: ALDACTONE Take 25 mg by mouth daily.        Follow-up Information     Dyane Carlyon Norris, FNP Follow up.   Specialty: Family Medicine Why: hospital follow up Contact information: [address] [place] [address] [phone]                 Subjective   Pt reports feeling well. Denies weakness, nausea, vomiting, abdominal pain.  Tolerating a normal diet.   All questions and concerns were addressed at time of discharge.  Objective  Blood pressure 136/83, pulse 95, temperature 98 F (36.7 C), resp. rate 18, height 5' 9 (1.753 m), weight 80.3 kg, SpO2 100%.   General: Pt is alert, awake, not in acute distress Cardiovascular: RRR, S1/S2 +, no rubs, no gallops Respiratory: CTA bilaterally, no wheezing, no rhonchi Abdominal: Soft, NT, ND, bowel sounds + Extremities: no edema, no cyanosis  The results of significant diagnostics from this hospitalization (including imaging, microbiology, ancillary and laboratory) are listed below for reference.   Imaging studies: CT Cervical Spine Wo Contrast Result Date: 03/18/2024 EXAM: CT CERVICAL SPINE WITHOUT CONTRAST 03/18/2024 09:48:00 AM TECHNIQUE: CT of the cervical spine was performed without the administration of intravenous contrast. Multiplanar reformatted images are provided for review. Automated exposure control, iterative reconstruction, and/or weight based adjustment of the mA/kV was utilized to reduce the radiation dose to as low as reasonably achievable. COMPARISON: CT of the cervical spine dated 02/04/2024. CLINICAL HISTORY: Facial trauma, blunt. Fall, pt rolled off couch this AM, found down, also code stroke. FINDINGS: CERVICAL SPINE: BONES AND ALIGNMENT: No acute fracture or traumatic malalignment. DEGENERATIVE CHANGES: No significant degenerative changes. SOFT TISSUES: There is a simple cyst present  in the subcutaneous fat of the upper back on the left, measuring approximately 20 x 15 x 16 mm. IMPRESSION: 1. No acute abnormality of the cervical spine related to the reported facial trauma and fall. Electronically signed by: evalene coho 03/18/2024 10:43 AM EDT RP Workstation: [id number]   CT HEAD CODE STROKE WO CONTRAST Result Date: 03/18/2024 EXAM: CT HEAD WITHOUT 03/18/2024 09:43:00 AM TECHNIQUE: CT of the head was performed without the administration of intravenous contrast. Automated exposure control, iterative reconstruction, and/or weight based adjustment of the mA/kV was utilized to reduce the radiation dose to as low as reasonably achievable. COMPARISON: CT of the head dated February 04, 2024. CLINICAL HISTORY: Neuro deficit, acute, stroke suspected. Code stroke, LWK 9:30pm 8/10, lethargy, right sided weakness; Removed earrings; Dr Matthews [phone] FINDINGS: BRAIN AND VENTRICLES: No acute intracranial hemorrhage. No mass effect or midline shift. No extra-axial fluid collection. Gray-white differentiation is maintained. No hydrocephalus. There is mild periventricular white matter disease present. There is no evidence of acute infarct. [place] stroke program early CT (ASPECT) score: Ganglionic (caudate, IC, lentiform nucleus, insula, M1-M3): 7 Supraganglionic (M4-M6): 3 Total: 10 ORBITS: No acute abnormality. SINUSES AND MASTOIDS: No acute abnormality. SOFT TISSUES  AND SKULL: No acute skull fracture. No acute soft tissue abnormality. IMPRESSION: 1. No acute intracranial abnormality. Aspect score is 10. 2. Mild periventricular white matter disease. 3. The above findings were communicated to Dr. Matthews at 9:48 am 03/18/2024 via Amion. Electronically signed by: evalene coho 03/18/2024 09:52 AM EDT RP Workstation: [id number]    Labs: Basic Metabolic Panel: Recent Labs  Lab 03/18/24 0932 03/18/24 1143 03/19/24 0438 03/20/24 0442 03/20/24 1042  NA 138  --   --  136 138  K 3.0*  --   --  5.5*  4.4  CL 102  --   --  103 103  CO2 23  --   --  27 27  GLUCOSE 38*  --   --  188* 115*  BUN <5*  --   --  <5* <5*  CREATININE 0.87  --   --  0.91 0.81  CALCIUM  8.0*  --   --  8.5* 8.7*  MG  --  1.3* 1.9  --   --    CBC: Recent Labs  Lab 03/18/24 0932 03/20/24 0442  WBC 6.9 5.3  NEUTROABS 4.9  --   HGB 12.5* 12.1*  HCT 36.5* 35.7*  MCV 94.8 95.7  PLT 120* 118*   Microbiology: Results for orders placed or performed during the hospital encounter of 10/26/20  Resp Panel by RT-PCR (Flu A&B, Covid) Nasopharyngeal Swab     Status: None   Collection Time: 10/26/20  9:22 PM   Specimen: Nasopharyngeal Swab; Nasopharyngeal(NP) swabs in vial transport medium  Result Value Ref Range Status   SARS Coronavirus 2 by RT PCR NEGATIVE NEGATIVE Final    Comment: (NOTE) SARS-CoV-2 target nucleic acids are NOT DETECTED.  The SARS-CoV-2 RNA is generally detectable in upper respiratory specimens during the acute phase of infection. The lowest concentration of SARS-CoV-2 viral copies this assay can detect is 138 copies/mL. A negative result does not preclude SARS-Cov-2 infection and should not be used as the sole basis for treatment or other patient management decisions. A negative result may occur with  improper specimen collection/handling, submission of specimen other than nasopharyngeal swab, presence of viral mutation(s) within the areas targeted by this assay, and inadequate number of viral copies(<138 copies/mL). A negative result must be combined with clinical observations, patient history, and epidemiological information. The expected result is Negative.  Fact Sheet for Patients:  BloggerCourse.com  Fact Sheet for Healthcare Providers:  SeriousBroker.it  This test is no t yet approved or cleared by the [place]  FDA and  has been authorized for detection and/or diagnosis of SARS-CoV-2 by FDA under an Emergency Use Authorization  (EUA). This EUA will remain  in effect (meaning this test can be used) for the duration of the COVID-19 declaration under Section 564(b)(1) of the Act, 21 U.S.C.section 360bbb-3(b)(1), unless the authorization is terminated  or revoked sooner.       Influenza A by PCR NEGATIVE NEGATIVE Final   Influenza B by PCR NEGATIVE NEGATIVE Final    Comment: (NOTE) The Xpert Xpress SARS-CoV-2/FLU/RSV plus assay is intended as an aid in the diagnosis of influenza from Nasopharyngeal swab specimens and should not be used as a sole basis for treatment. Nasal washings and aspirates are unacceptable for Xpert Xpress SARS-CoV-2/FLU/RSV testing.  Fact Sheet for Patients: BloggerCourse.com  Fact Sheet for Healthcare Providers: SeriousBroker.it  This test is not yet approved or cleared by the [place]  FDA and has been authorized for detection and/or diagnosis of SARS-CoV-2 by FDA under an  Emergency Use Authorization (EUA). This EUA will remain in effect (meaning this test can be used) for the duration of the COVID-19 declaration under Section 564(b)(1) of the Act, 21 U.S.C. section 360bbb-3(b)(1), unless the authorization is terminated or revoked.  Performed at [facility], [address] [place], [place] [address]   MRSA PCR Screening     Status: None   Collection Time: 10/27/20 10:07 PM   Specimen: Nasopharyngeal  Result Value Ref Range Status   MRSA by PCR NEGATIVE NEGATIVE Final    Comment:        The GeneXpert MRSA Assay (FDA approved for NASAL specimens only), is one component of a comprehensive MRSA colonization surveillance program. It is not intended to diagnose MRSA infection nor to guide or monitor treatment for MRSA infections. Performed at [facility], [address], [place], [place] [address]     Time coordinating discharge: Over 30 minutes  Marien LITTIE Piety, MD  [facility] 03/20/2024, 12:06 PM

## 2024-07-29 ENCOUNTER — Emergency Department

## 2024-07-29 ENCOUNTER — Other Ambulatory Visit: Payer: Self-pay

## 2024-07-29 ENCOUNTER — Inpatient Hospital Stay
Admission: EM | Admit: 2024-07-29 | Discharge: 2024-07-31 | DRG: 563 | Disposition: A | Discharge status: Home / Self Care | Attending: Internal Medicine | Admitting: Internal Medicine

## 2024-07-29 DIAGNOSIS — Z794 Long term (current) use of insulin: Secondary | ICD-10-CM

## 2024-07-29 DIAGNOSIS — W19XXXA Unspecified fall, initial encounter: Secondary | ICD-10-CM

## 2024-07-29 DIAGNOSIS — E876 Hypokalemia: Secondary | ICD-10-CM | POA: Diagnosis present

## 2024-07-29 DIAGNOSIS — R296 Repeated falls: Secondary | ICD-10-CM | POA: Diagnosis present

## 2024-07-29 DIAGNOSIS — Z7982 Long term (current) use of aspirin: Secondary | ICD-10-CM

## 2024-07-29 DIAGNOSIS — R509 Fever, unspecified: Secondary | ICD-10-CM | POA: Diagnosis not present

## 2024-07-29 DIAGNOSIS — S42292A Other displaced fracture of upper end of left humerus, initial encounter for closed fracture: Principal | ICD-10-CM | POA: Diagnosis present

## 2024-07-29 DIAGNOSIS — S2249XA Multiple fractures of ribs, unspecified side, initial encounter for closed fracture: Secondary | ICD-10-CM

## 2024-07-29 DIAGNOSIS — S42302A Unspecified fracture of shaft of humerus, left arm, initial encounter for closed fracture: Secondary | ICD-10-CM | POA: Diagnosis present

## 2024-07-29 DIAGNOSIS — I10 Essential (primary) hypertension: Secondary | ICD-10-CM | POA: Diagnosis present

## 2024-07-29 DIAGNOSIS — Z7984 Long term (current) use of oral hypoglycemic drugs: Secondary | ICD-10-CM

## 2024-07-29 DIAGNOSIS — E7849 Other hyperlipidemia: Secondary | ICD-10-CM | POA: Diagnosis present

## 2024-07-29 DIAGNOSIS — K219 Gastro-esophageal reflux disease without esophagitis: Secondary | ICD-10-CM | POA: Diagnosis present

## 2024-07-29 DIAGNOSIS — Y900 Blood alcohol level of less than 20 mg/100 ml: Secondary | ICD-10-CM | POA: Diagnosis present

## 2024-07-29 DIAGNOSIS — E1169 Type 2 diabetes mellitus with other specified complication: Secondary | ICD-10-CM | POA: Diagnosis present

## 2024-07-29 DIAGNOSIS — S2241XA Multiple fractures of ribs, right side, initial encounter for closed fracture: Secondary | ICD-10-CM | POA: Diagnosis present

## 2024-07-29 DIAGNOSIS — Y92003 Bedroom of unspecified non-institutional (private) residence as the place of occurrence of the external cause: Secondary | ICD-10-CM

## 2024-07-29 DIAGNOSIS — W2203XA Walked into furniture, initial encounter: Secondary | ICD-10-CM | POA: Diagnosis present

## 2024-07-29 DIAGNOSIS — E785 Hyperlipidemia, unspecified: Secondary | ICD-10-CM | POA: Diagnosis present

## 2024-07-29 DIAGNOSIS — F1010 Alcohol abuse, uncomplicated: Secondary | ICD-10-CM | POA: Diagnosis present

## 2024-07-29 DIAGNOSIS — Z888 Allergy status to other drugs, medicaments and biological substances status: Secondary | ICD-10-CM

## 2024-07-29 DIAGNOSIS — F17210 Nicotine dependence, cigarettes, uncomplicated: Secondary | ICD-10-CM | POA: Diagnosis present

## 2024-07-29 DIAGNOSIS — Z79899 Other long term (current) drug therapy: Secondary | ICD-10-CM

## 2024-07-29 DIAGNOSIS — S42202A Unspecified fracture of upper end of left humerus, initial encounter for closed fracture: Principal | ICD-10-CM

## 2024-07-29 DIAGNOSIS — I4891 Unspecified atrial fibrillation: Secondary | ICD-10-CM | POA: Diagnosis present

## 2024-07-29 DIAGNOSIS — T07XXXA Unspecified multiple injuries, initial encounter: Secondary | ICD-10-CM | POA: Diagnosis present

## 2024-07-29 DIAGNOSIS — I110 Hypertensive heart disease with heart failure: Secondary | ICD-10-CM | POA: Diagnosis present

## 2024-07-29 LAB — COMPREHENSIVE METABOLIC PANEL WITH GFR
ALT: 59 U/L — ABNORMAL HIGH (ref 0–44)
AST: 51 U/L — ABNORMAL HIGH (ref 15–41)
Albumin: 3.5 g/dL (ref 3.5–5.0)
Alkaline Phosphatase: 144 U/L — ABNORMAL HIGH (ref 38–126)
Anion gap: 10 (ref 5–15)
BUN: 8 mg/dL (ref 6–20)
CO2: 29 mmol/L (ref 22–32)
Calcium: 9.1 mg/dL (ref 8.9–10.3)
Chloride: 101 mmol/L (ref 98–111)
Creatinine, Ser: 0.93 mg/dL (ref 0.61–1.24)
GFR, Estimated: >60 mL/min
Glucose, Bld: 146 mg/dL — ABNORMAL HIGH (ref 70–99)
Potassium: 3.1 mmol/L — ABNORMAL LOW (ref 3.5–5.1)
Sodium: 140 mmol/L (ref 135–145)
Total Bilirubin: 0.5 mg/dL (ref 0.0–1.2)
Total Protein: 6.3 g/dL — ABNORMAL LOW (ref 6.5–8.1)

## 2024-07-29 LAB — CBC WITH DIFFERENTIAL/PLATELET
Abs Immature Granulocytes: 0.03 K/uL (ref 0.00–0.07)
Basophils Absolute: 0 K/uL (ref 0.0–0.1)
Basophils Relative: 0 %
Eosinophils Absolute: 0 K/uL (ref 0.0–0.5)
Eosinophils Relative: 0 %
HCT: 38.2 % — ABNORMAL LOW (ref 39.0–52.0)
Hemoglobin: 12.4 g/dL — ABNORMAL LOW (ref 13.0–17.0)
Immature Granulocytes: 0 %
Lymphocytes Relative: 15 %
Lymphs Abs: 1.2 K/uL (ref 0.7–4.0)
MCH: 30.7 pg (ref 26.0–34.0)
MCHC: 32.5 g/dL (ref 30.0–36.0)
MCV: 94.6 fL (ref 80.0–100.0)
Monocytes Absolute: 0.8 K/uL (ref 0.1–1.0)
Monocytes Relative: 10 %
Neutro Abs: 6 K/uL (ref 1.7–7.7)
Neutrophils Relative %: 75 %
Platelets: 208 K/uL (ref 150–400)
RBC: 4.04 MIL/uL — ABNORMAL LOW (ref 4.22–5.81)
RDW: 13.7 % (ref 11.5–15.5)
WBC: 8.1 K/uL (ref 4.0–10.5)
nRBC: 0 % (ref 0.0–0.2)

## 2024-07-29 LAB — GLUCOSE, CAPILLARY
Glucose-Capillary: 195 mg/dL — ABNORMAL HIGH (ref 70–99)
Glucose-Capillary: 328 mg/dL — ABNORMAL HIGH (ref 70–99)

## 2024-07-29 LAB — ETHANOL: Alcohol, Ethyl (B): <15 mg/dL

## 2024-07-29 LAB — TROPONIN T, HIGH SENSITIVITY
Troponin T High Sensitivity: 79 ng/L — ABNORMAL HIGH (ref 0–19)
Troponin T High Sensitivity: 57 ng/L — ABNORMAL HIGH (ref 0–19)

## 2024-07-29 LAB — MAGNESIUM: Magnesium: 2 mg/dL (ref 1.7–2.4)

## 2024-07-29 MED ORDER — POTASSIUM CHLORIDE CRYS ER 20 MEQ PO TBCR
40.0000 meq | EXTENDED_RELEASE_TABLET | Freq: Once | ORAL | Status: AC
  Administered 2024-07-29: 40 meq via ORAL
  Filled 2024-07-29: qty 2

## 2024-07-29 MED ORDER — SODIUM CHLORIDE 0.9 % IV BOLUS
1000.0000 mL | Freq: Once | INTRAVENOUS | Status: AC
  Administered 2024-07-29: 1000 mL via INTRAVENOUS

## 2024-07-29 MED ORDER — ACETAMINOPHEN 500 MG PO TABS
1000.0000 mg | ORAL_TABLET | Freq: Once | ORAL | Status: AC
  Administered 2024-07-29: 1000 mg via ORAL
  Filled 2024-07-29: qty 2

## 2024-07-29 MED ORDER — OXYCODONE HCL 5 MG PO TABS
5.0000 mg | ORAL_TABLET | ORAL | Status: DC | PRN
  Administered 2024-07-29 – 2024-07-30 (×4): 5 mg via ORAL
  Filled 2024-07-29 (×5): qty 1

## 2024-07-29 MED ORDER — EMPAGLIFLOZIN 25 MG PO TABS
25.0000 mg | ORAL_TABLET | Freq: Every day | ORAL | Status: DC
  Administered 2024-07-30 – 2024-07-31 (×2): 25 mg via ORAL
  Filled 2024-07-29 (×2): qty 1

## 2024-07-29 MED ORDER — ONDANSETRON HCL 4 MG/2ML IJ SOLN: 4.0000 mg | Freq: Four times a day (QID) | INTRAMUSCULAR | Status: DC | PRN

## 2024-07-29 MED ORDER — HYDROMORPHONE HCL 1 MG/ML IJ SOLN
0.5000 mg | INTRAMUSCULAR | Status: DC | PRN
  Administered 2024-07-29 – 2024-07-31 (×10): 1 mg via INTRAVENOUS
  Administered 2024-07-31: 0.5 mg via INTRAVENOUS
  Administered 2024-07-31 (×3): 1 mg via INTRAVENOUS
  Filled 2024-07-29 (×15): qty 1

## 2024-07-29 MED ORDER — HEPARIN SODIUM (PORCINE) 5000 UNIT/ML IJ SOLN
5000.0000 [IU] | Freq: Three times a day (TID) | INTRAMUSCULAR | Status: DC
  Administered 2024-07-29 – 2024-07-31 (×7): 5000 [IU] via SUBCUTANEOUS
  Filled 2024-07-29 (×8): qty 1

## 2024-07-29 MED ORDER — PANTOPRAZOLE SODIUM 40 MG PO TBEC
40.0000 mg | DELAYED_RELEASE_TABLET | Freq: Every day | ORAL | Status: DC
  Administered 2024-07-30 – 2024-07-31 (×2): 40 mg via ORAL
  Filled 2024-07-29 (×2): qty 1

## 2024-07-29 MED ORDER — INSULIN ASPART 100 UNIT/ML IJ SOLN
0.0000 [IU] | Freq: Every day | INTRAMUSCULAR | Status: DC
  Administered 2024-07-29: 4 [IU] via SUBCUTANEOUS
  Administered 2024-07-30: 2 [IU] via SUBCUTANEOUS
  Filled 2024-07-29: qty 4
  Filled 2024-07-29: qty 2

## 2024-07-29 MED ORDER — ACETAMINOPHEN 325 MG PO TABS
650.0000 mg | ORAL_TABLET | Freq: Four times a day (QID) | ORAL | Status: DC | PRN
  Administered 2024-07-30 – 2024-07-31 (×3): 650 mg via ORAL
  Filled 2024-07-29 (×3): qty 2

## 2024-07-29 MED ORDER — FENOFIBRATE 54 MG PO TABS
54.0000 mg | ORAL_TABLET | Freq: Every day | ORAL | Status: DC
  Administered 2024-07-30 – 2024-07-31 (×2): 54 mg via ORAL
  Filled 2024-07-29 (×2): qty 1

## 2024-07-29 MED ORDER — INSULIN ASPART 100 UNIT/ML IJ SOLN
0.0000 [IU] | Freq: Three times a day (TID) | INTRAMUSCULAR | Status: DC
  Administered 2024-07-29: 2 [IU] via SUBCUTANEOUS
  Administered 2024-07-30: 1 [IU] via SUBCUTANEOUS
  Administered 2024-07-30 – 2024-07-31 (×3): 2 [IU] via SUBCUTANEOUS
  Filled 2024-07-29 (×5): qty 2

## 2024-07-29 MED ORDER — AMITRIPTYLINE HCL 25 MG PO TABS
75.0000 mg | ORAL_TABLET | Freq: Every day | ORAL | Status: DC
  Administered 2024-07-29 – 2024-07-30 (×2): 75 mg via ORAL
  Filled 2024-07-29 (×2): qty 3

## 2024-07-29 MED ORDER — GABAPENTIN 400 MG PO CAPS
800.0000 mg | ORAL_CAPSULE | Freq: Three times a day (TID) | ORAL | Status: DC
  Administered 2024-07-29 – 2024-07-31 (×5): 800 mg via ORAL
  Filled 2024-07-29 (×5): qty 2

## 2024-07-29 MED ORDER — ACETAMINOPHEN 650 MG RE SUPP: 650.0000 mg | Freq: Four times a day (QID) | RECTAL | Status: DC | PRN

## 2024-07-29 MED ORDER — SODIUM CHLORIDE 0.9% FLUSH
3.0000 mL | Freq: Two times a day (BID) | INTRAVENOUS | Status: DC
  Administered 2024-07-29 – 2024-07-31 (×5): 3 mL via INTRAVENOUS

## 2024-07-29 MED ORDER — ATORVASTATIN CALCIUM 80 MG PO TABS
80.0000 mg | ORAL_TABLET | Freq: Every day | ORAL | Status: DC
  Administered 2024-07-29 – 2024-07-30 (×2): 80 mg via ORAL
  Filled 2024-07-29 (×2): qty 1

## 2024-07-29 MED ORDER — HYDROMORPHONE HCL 1 MG/ML IJ SOLN
1.0000 mg | Freq: Once | INTRAMUSCULAR | Status: AC
  Administered 2024-07-29: 1 mg via INTRAVENOUS
  Filled 2024-07-29: qty 1

## 2024-07-29 MED ORDER — METOPROLOL SUCCINATE ER 50 MG PO TB24
50.0000 mg | ORAL_TABLET | Freq: Every day | ORAL | Status: DC
  Administered 2024-07-29 – 2024-07-31 (×3): 50 mg via ORAL
  Filled 2024-07-29 (×3): qty 1

## 2024-07-29 MED ORDER — ONDANSETRON HCL 4 MG PO TABS: 4.0000 mg | ORAL_TABLET | Freq: Four times a day (QID) | ORAL | Status: DC | PRN

## 2024-07-29 MED ORDER — SENNOSIDES-DOCUSATE SODIUM 8.6-50 MG PO TABS: 1.0000 | ORAL_TABLET | Freq: Every evening | ORAL | Status: DC | PRN

## 2024-07-29 MED ORDER — AMLODIPINE BESYLATE 10 MG PO TABS
10.0000 mg | ORAL_TABLET | Freq: Every day | ORAL | Status: DC
  Administered 2024-07-30 – 2024-07-31 (×2): 10 mg via ORAL
  Filled 2024-07-29 (×2): qty 1

## 2024-07-29 NOTE — ED Provider Notes (Signed)
 "  [facility] Provider Note    Event Date/Time   First MD Initiated Contact with Patient 07/29/24 [phone]     (approximate)   History   Fall  Pt to ED via [facility] from home. Pt had fall yesterday. No LOC but pt reports hitting head on desk. EMS reports pt has been having frequent falls due to glucose drops. Pt complains of L arm pain and R side pain.  EMS  HR 102 99% RA BP 142/78 CBG 197    HPI Micheal Wong is a 53 y.o. male PMH diabetes, hypertension, alcoholic pancreatitis, CHF, A-fib not on anticoagulation, hyponatremia, hyperlipidemia presents for evaluation after fall - Somewhat scattered historian but patient reports that he got up around 4-5 AM yesterday morning to use the bathroom, apparently was bending over to pick up a blanket when he was getting back to bed and fell.  Hit his head on the dresser.  Denies LOC.  Not on thinners.  Was able to get himself in the bed.  Says he had notable pain to his left shoulder and right chest wall after the fall so did not get out of bed yesterday.  Today friend called an ambulance and he was having difficulty getting out of bed but tells me he was able to walk down to the ambulance.  Took gabapentin  for pain yesterday, has not taken any pain medications today.  Denies any alcohol use.  No recent infectious symptoms, has otherwise been in his usual state of health.  No chest pain, palpitations. - Contrary to triage note, denies any other recent falls.   Tells me his last fall was greater than a month ago. - Glucose normal with EMS   Per chart review, patient was last admitted to our facility 03/18/2024-03/20/2024 after presenting with hypoglycemia.  Ultimately thought to have had too high of a dose of insulin .  Insulin  discontinued, continued metformin  and Jardiance .     Physical Exam   Triage Vital Signs: ED Triage Vitals  Encounter Vitals Group     BP 07/29/24 0903 133/81     Girls Systolic BP Percentile --       Girls Diastolic BP Percentile --      Boys Systolic BP Percentile --      Boys Diastolic BP Percentile --      Pulse Rate 07/29/24 0903 97     Resp 07/29/24 0903 18     Temp 07/29/24 0905 98.1 F (36.7 C)     Temp src --      SpO2 07/29/24 0903 100 %     Weight 07/29/24 0905 172 lb 6.4 oz (78.2 kg)     Height 07/29/24 0905 5' 9 (1.753 m)     Head Circumference --      Peak Flow --      Pain Score 07/29/24 0905 8     Pain Loc --      Pain Education --      Exclude from Growth Chart --     Most recent vital signs: Vitals:   07/29/24 1311 07/29/24 1317  BP: 117/89   Pulse: (!) 103   Resp: 13   Temp:  97.7 F (36.5 C)  SpO2: 100%      General: Awake, no distress.  HEENT: normocephalic, atraumatic, no midline neck pain CV:  Good peripheral perfusion. RRR, RP 2+ Resp:  Normal effort. CTAB Abd:  No distention. Nontender to deep palpation throughout Other:  Tender to palpation on  right chest wall with no obvious deformities, no bruising, no paradoxical movement.  Also tender throughout left upper arm.  No tenderness in lower arm.  Radian/median/ulnar motor intact.  No tenderness elsewhere throughout extremities.  No obvious deformities appreciated.   ED Results / Procedures / Treatments   Labs (all labs ordered are listed, but only abnormal results are displayed) Labs Reviewed  CBC WITH DIFFERENTIAL/PLATELET - Abnormal; Notable for the following components:      Result Value   RBC 4.04 (*)    Hemoglobin 12.4 (*)    HCT 38.2 (*)    All other components within normal limits  COMPREHENSIVE METABOLIC PANEL WITH GFR - Abnormal; Notable for the following components:   Potassium 3.1 (*)    Glucose, Bld 146 (*)    Total Protein 6.3 (*)    AST 51 (*)    ALT 59 (*)    Alkaline Phosphatase 144 (*)    All other components within normal limits  TROPONIN T, HIGH SENSITIVITY - Abnormal; Notable for the following components:   Troponin T High Sensitivity 79 (*)    All other  components within normal limits  TROPONIN T, HIGH SENSITIVITY - Abnormal; Notable for the following components:   Troponin T High Sensitivity 57 (*)    All other components within normal limits  ETHANOL  MAGNESIUM   URINALYSIS, COMPLETE (UACMP) WITH MICROSCOPIC     EKG  N/a   RADIOLOGY Radiology interpreted by myself radiology report reviewed.  Notable for multiple right-sided rib fractures and left proximal humerus fracture.    PROCEDURES:  Critical Care performed: No  Procedures   MEDICATIONS ORDERED IN ED: Medications  sodium chloride  flush (NS) 0.9 % injection 3 mL (has no administration in time range)  acetaminophen  (TYLENOL ) tablet 650 mg (has no administration in time range)    Or  acetaminophen  (TYLENOL ) suppository 650 mg (has no administration in time range)  senna-docusate (Senokot-S) tablet 1 tablet (has no administration in time range)  heparin  injection 5,000 Units (has no administration in time range)  oxyCODONE  (Oxy IR/ROXICODONE ) immediate release tablet 5 mg (has no administration in time range)  HYDROmorphone  (DILAUDID ) injection 0.5-1 mg (has no administration in time range)  ondansetron  (ZOFRAN ) tablet 4 mg (has no administration in time range)    Or  ondansetron  (ZOFRAN ) injection 4 mg (has no administration in time range)  sodium chloride  0.9 % bolus 1,000 mL (0 mLs Intravenous Stopped 07/29/24 1151)  HYDROmorphone  (DILAUDID ) injection 1 mg (1 mg Intravenous Given 07/29/24 1012)  acetaminophen  (TYLENOL ) tablet 1,000 mg (1,000 mg Oral Given 07/29/24 1011)  potassium chloride  SA (KLOR-CON  M) CR tablet 40 mEq (40 mEq Oral Given 07/29/24 1148)     IMPRESSION / MDM / ASSESSMENT AND PLAN / ED COURSE  I reviewed the triage vital signs and the nursing notes.                              DDX/MDM/AP: Differential diagnosis includes, but is not limited to, mechanical fall, consider underlying anemia or electrolyte derangement, no symptoms to suggest  recent infection.  Doubt hypoglycemia precipitating event given normal glucose with EMS.  Regarding pain, consider left humerus fracture or shoulder dislocation.  Consider right-sided rib fractures.  Given somewhat limited historian with history of head strike and some new difficulty walking, will screen CT head, no clinical concern for C-spine injury at this time.  Plan: - Labs - Pain control - X-ray  left humerus, left elbow, right chest -  CT head -  reassess  Patient's presentation is most consistent with acute presentation with potential threat to life or bodily function.  The patient is on the cardiac monitor to evaluate for evidence of arrhythmia and/or significant heart rate changes.  ED course below.  Workup with left humerus fracture and 3 right-sided rib fractures.  Left arm placed in sling, Ortho consulted, recommended CT for possible surgical planning though does not require emergent intervention.  Patient unsteady with gait here and does not feel comfortable with discharge home, discussed with hospitalist who is amenable to admission for pain control, PT/OT, pulmonary toilet.  Admitted to hospitalist service.  Clinical Course as of 07/29/24 1442  Mon Jul 29, 2024  0938 CBC with no leukocytosis, stable mild anemia [MM]  1012 X-ray left humerus with comminuted displaced proximal humerus fracture on my interpretation, formal radiology read pending [MM]  1013 Elbow plain films with no acute pathology on my interpretation [MM]  1014 X-ray right chest with 2-3 possible lower rib fractures, no evidence of pneumothorax on my read, formal radiology read pending [MM]  1027 Cmp w/ mild hypokalemia, will replete p.o.  Transaminitis similar/improved from prior [MM]  1038 Given no intracranial hemorrhage on my interpretation, formal read pending [MM]  1038 Paging ortho [MM]  1041 D/w Dr. Ezra of ortho - Can be managed as outpatient but does recommend CT of shoulder to further evaluate  fracture pattern -Sling, no indication for splinting at this time [MM]  1054 XR R chest: IMPRESSION: 1. Potential acute fractures of the posterior right 12th, 9th, and 8th ribs. 2. No pneumothorax. 3. Healed fractures of the posterior right 10th and 11th ribs with callus formation.   [MM]  1054 CTH: IMPRESSION: 1. No acute intracranial abnormality. 2. Mucosal thickening in the right sphenoid sinus.   [MM]  1242 Rpt trop downtrending [MM]  1303 Patient reevaluated, remains able to ambulate though somewhat unsteady.  Expresses concern about potential discharge home given prior falls with prolonged downtime's.  Will attempt to provide urine sample now.  Will discuss with admitting hospitalist, paged [MM]    Clinical Course User Index [MM] Clarine Ozell LABOR, MD     FINAL CLINICAL IMPRESSION(S) / ED DIAGNOSES   Final diagnoses:  Closed fracture of proximal end of left humerus, unspecified fracture morphology, initial encounter  Multiple fractures of ribs, right side, initial encounter for closed fracture  Fall, initial encounter     Rx / DC Orders   ED Discharge Orders     None        Note:  This document was prepared using Dragon voice recognition software and may include unintentional dictation errors.   Clarine Ozell LABOR, MD 07/29/24 1442  "

## 2024-07-29 NOTE — Plan of Care (Signed)
  Problem: Education: Goal: Knowledge of General Education information will improve Description: Including pain rating scale, medication(s)/side effects and non-pharmacologic comfort measures Outcome: Progressing   Problem: Clinical Measurements: Goal: Ability to maintain clinical measurements within normal limits will improve Outcome: Progressing Goal: Will remain free from infection Outcome: Progressing Goal: Diagnostic test results will improve Outcome: Progressing Goal: Respiratory complications will improve Outcome: Progressing Goal: Cardiovascular complication will be avoided Outcome: Progressing   Problem: Activity: Goal: Risk for activity intolerance will decrease Outcome: Progressing   Problem: Coping: Goal: Level of anxiety will decrease Outcome: Progressing   Problem: Pain Managment: Goal: General experience of comfort will improve and/or be controlled Outcome: Progressing   Problem: Safety: Goal: Ability to remain free from injury will improve Outcome: Progressing

## 2024-07-29 NOTE — ED Notes (Signed)
 CCMD called to place pt on central monitoring.

## 2024-07-29 NOTE — Plan of Care (Signed)
   Problem: Education: Goal: Knowledge of General Education information will improve Description: Including pain rating scale, medication(s)/side effects and non-pharmacologic comfort measures Outcome: Progressing   Problem: Nutrition: Goal: Adequate nutrition will be maintained Outcome: Progressing

## 2024-07-29 NOTE — Consult Note (Addendum)
 "                                                                ORTHOPAEDIC CONSULTATION  REQUESTING PHYSICIAN: Fernand Prost, MD  Chief Complaint:   Left shoulder pain  History of Present Illness: Micheal Wong is a 53 y.o. male with a past medical history of diabetes, hypertension, alcoholic pancreatitis, congestive heart failure, A-fib who presents with left shoulder pain after a fall.  He had a mechanical ground-level fall yesterday morning noting immediate and significant left shoulder pain afterwards.  He was found to have a left proximal humerus fracture and orthopedics has been consulted for this injury.  He denies any numbness or tingling.  He endorses continued left shoulder pain.  Past Medical History:  Diagnosis Date   Diabetes mellitus without complication (HCC)    Hypertension    Pancreatitis    History reviewed. No pertinent surgical history. Social History   Socioeconomic History   Marital status: Unknown    Spouse name: Not on file   Number of children: Not on file   Years of education: Not on file   Highest education level: Not on file  Occupational History   Not on file  Tobacco Use   Smoking status: Every Day    Current packs/day: 0.50    Types: Cigarettes   Smokeless tobacco: Never  Substance and Sexual Activity   Alcohol use: Yes    Comment: occasionally   Drug use: Not Currently   Sexual activity: Not on file  Other Topics Concern   Not on file  Social History Narrative   Not on file   Social Drivers of Health   Tobacco Use: High Risk (07/29/2024)   Patient History    Smoking Tobacco Use: Every Day    Smokeless Tobacco Use: Never    Passive Exposure: Not on file  Financial Resource Strain: Medium Risk (08/03/2023)   Received from [facility]   Overall Financial Resource Strain (CARDIA)    Difficulty of Paying Living Expenses: Somewhat hard  Food Insecurity: No Food Insecurity (07/29/2024)   Epic    Worried About Running Out  of Food in the Last Year: Never true    Ran Out of Food in the Last Year: Never true  Transportation Needs: No Transportation Needs (07/29/2024)   Epic    Lack of Transportation (Medical): No    Lack of Transportation (Non-Medical): No  Physical Activity: Inactive (08/03/2023)   Received from [facility]   Exercise Vital Sign    On average, how many minutes do you engage in exercise at this level?: 0 min    On average, how many days per week do you engage in moderate to strenuous exercise (like a brisk walk)?: 0 days  Stress: Stress Concern Present (08/03/2023)   Received from [facility] - Occupational Stress Questionnaire    Feeling of Stress : To some extent  Social Connections: Moderately Isolated (03/18/2024)   Social Connection and Isolation Panel    Frequency of Communication with Friends and Family: More than three times a week    Frequency of Social Gatherings with Friends and Family: More than three times a week    Attends  Religious Services: More than 4 times per year    Active Member of Clubs or Organizations: No    Attends Banker Meetings: Never    Marital Status: Widowed  Depression (PHQ2-9): Not on file  Alcohol Screen: Not on file  Housing: Low Risk (07/29/2024)   Epic    Unable to Pay for Housing in the Last Year: No    Number of Times Moved in the Last Year: 0    Homeless in the Last Year: No  Utilities: Not At Risk (07/29/2024)   Epic    Threatened with loss of utilities: No  Health Literacy: Adequate Health Literacy (08/03/2023)   Received from [facility]   B1300 Health Literacy    Frequency of need for help with medical instructions: Never   History reviewed. No pertinent family history. Allergies[1] Prior to Admission medications  Medication Sig Start Date End Date Taking? Authorizing Provider  amitriptyline  (ELAVIL ) 75 MG tablet Take 75 mg by  mouth at bedtime. 10/19/23  Yes [provider]  amLODipine  (NORVASC ) 10 MG tablet Take 10 mg by mouth daily.   Yes [provider]  aspirin  81 MG chewable tablet Chew 81 mg by mouth daily. 11/27/23 11/26/24 Yes [provider]  atorvastatin  (LIPITOR ) 80 MG tablet Take 1 tablet (80 mg total) by mouth at bedtime. 03/20/24 07/29/24 Yes Lenon Marien CROME, MD  fenofibrate  54 MG tablet Take 1 tablet (54 mg total) by mouth daily. 03/21/24  Yes Lenon Marien CROME, MD  gabapentin  (NEURONTIN ) 800 MG tablet Take 800 mg by mouth 3 (three) times daily.   Yes [provider]  JARDIANCE  25 MG TABS tablet Take 25 mg by mouth daily. 04/26/24  Yes [provider]  metFORMIN  (GLUCOPHAGE ) 500 MG tablet Take 500 mg by mouth daily with breakfast. Not sure of the dose   Yes [provider]  metoprolol  succinate (TOPROL -XL) 50 MG 24 hr tablet Take 50 mg by mouth daily. 06/03/22 07/29/24 Yes [provider]  omeprazole (PRILOSEC) 20 MG capsule Take 20 mg by mouth daily. 09/20/23  Yes [provider]  cyclobenzaprine  (FLEXERIL ) 10 MG tablet Take 10 mg by mouth 3 (three) times daily as needed. Patient not taking: Reported on 07/29/2024 01/31/24   [provider]  HUMALOG  KWIKPEN 100 UNIT/ML KwikPen Inject 5 Units into the skin 3 (three) times daily. Patient taking differently: Inject 12 Units into the skin 3 (three) times daily. 03/20/24   Lenon Marien CROME, MD  sitaGLIPtin (JANUVIA) 100 MG tablet Take 100 mg by mouth daily. Patient not taking: Reported on 07/29/2024 06/03/22   [provider]  spironolactone (ALDACTONE) 25 MG tablet Take 25 mg by mouth daily. Patient not taking: Reported on 07/29/2024 11/27/23 11/26/24  [provider]   Recent Labs    07/29/24 0915  WBC 8.1  HGB 12.4*  HCT 38.2*  PLT 208  K 3.1*  CL 101  CO2 29  BUN 8  CREATININE 0.93  GLUCOSE 146*  CALCIUM  9.1   CT Shoulder Left Wo  Contrast Result Date: 07/29/2024 CLINICAL DATA:  Proximal humerus fracture.  Preoperative evaluation. EXAM: CT OF THE UPPER LEFT EXTREMITY WITHOUT CONTRAST TECHNIQUE: Multidetector CT imaging of the upper left extremity was performed according to the standard protocol. RADIATION DOSE REDUCTION: This exam was performed according to the departmental dose-optimization program which includes automated exposure control, adjustment of the mA and/or kV according to patient size and/or use of iterative reconstruction technique. COMPARISON:  Earlier  same day radiographs of the left humerus. FINDINGS: Bones/Joint/Cartilage Acute oblique comminuted impacted fracture of the proximal humeral shaft extending from the proximal metadiaphysis through the proximal to mid shaft. The dominant comminuted butterfly fracture fragment measures approximately 7 cm in craniocaudal extent and 2.3 cm AP and is displaced approximately 1.7 cm posteriorly relative to the distal fracture component (series 6, images 37-51 and series 2, image 90). Fracture margins extend through the lateral humeral head and neck junction, near the level of the inferior aspect of the greater tuberosity. The humeral head articular surface is intact. No dislocation. Mild glenohumeral joint osteoarthritis. No additional acute fracture identified. The acromioclavicular joint is anatomically aligned. Ligaments Ligaments are suboptimally evaluated by CT. Muscles and Tendons Intramuscular edema surrounding the proximal humeral shaft fracture described above. Soft tissue Subcutaneous edema extending along the upper arm. No loculated fluid collection. No enlarged lymph nodes identified in the field of view. Thoracic aortic atherosclerosis. Visualized portions of the lungs are clear. IMPRESSION: 1. Acute comminuted displaced and impacted oblique fracture of the left proximal humeral shaft extending from the proximal metadiaphysis through the proximal to mid shaft, as  described above. No dislocation. 2. Intramuscular and subcutaneous edema of the left upper arm surrounding the proximal humeral shaft fracture. 3.  Aortic Atherosclerosis (ICD10-I70.0). Electronically Signed   By: Harrietta Sherry M.D.   On: 07/29/2024 12:41   DG Ribs Unilateral W/Chest Right Result Date: 07/29/2024 EXAM: 1 AP VIEW XRAY OF THE RIGHT RIBS AND CHEST 07/29/2024 10:06:00 AM COMPARISON: None available. CLINICAL HISTORY: fall FINDINGS: BONES: Healed fractures of the posterior right 10th and 11th ribs with callus formation. Potential acute fractures of the posterior right 8th, 9th, and 12th ribs. LUNGS AND PLEURA: No consolidation or pulmonary edema. No pleural effusion or pneumothorax. HEART AND MEDIASTINUM: Aortic calcification. No acute abnormality of the cardiac and mediastinal silhouettes. IMPRESSION: 1. Potential acute fractures of the posterior right 12th, 9th, and 8th ribs. 2. No pneumothorax. 3. Healed fractures of the posterior right 10th and 11th ribs with callus formation. Electronically signed by: Norleen Boxer MD 07/29/2024 10:52 AM EST RP Workstation: [id number]   CT HEAD WO CONTRAST ( ) Result Date: 07/29/2024 EXAM: CT HEAD WITHOUT CONTRAST 07/29/2024 10:29:51 AM TECHNIQUE: CT of the head was performed without the administration of intravenous contrast. Automated exposure control, iterative reconstruction, and/or weight based adjustment of the mA/kV was utilized to reduce the radiation dose to as low as reasonably achievable. COMPARISON: 03/18/2024 CLINICAL HISTORY: trauma FINDINGS: BRAIN AND VENTRICLES: No acute hemorrhage. No evidence of acute infarct. No hydrocephalus. No extra-axial collection. No mass effect or midline shift. ORBITS: No acute abnormality. SINUSES: Mucosal thickening in right sphenoid sinus. SOFT TISSUES AND SKULL: No acute soft tissue abnormality. No skull fracture. IMPRESSION: 1. No acute intracranial abnormality. 2. Mucosal thickening in the right sphenoid  sinus. Electronically signed by: Evalene Coho MD 07/29/2024 10:52 AM EST RP Workstation: [id number]   DG Humerus Left Result Date: 07/29/2024 EXAM: 1 VIEW(S) XRAY OF THE LEFT HUMERUS 07/29/2024 10:06:00 AM COMPARISON: None available. CLINICAL HISTORY: fall FINDINGS: BONES AND JOINTS: Oblique comminuted fracture of proximal humeral shaft. Medial angulation of distal fracture fragment. Glenohumeral joint intact. SOFT TISSUES: The soft tissues are unremarkable. IMPRESSION: 1. Oblique comminuted fracture of the proximal humeral shaft with medial angulation of the distal fracture fragment. Electronically signed by: Norleen Boxer MD 07/29/2024 10:46 AM EST RP Workstation: [id number]   DG Elbow Complete Left Result Date: 07/29/2024 EXAM: 3 VIEW(S) XRAY OF THE LEFT ELBOW COMPARISON: None  available. CLINICAL HISTORY: fall FINDINGS: BONES AND JOINTS: No acute fracture. No malalignment. SOFT TISSUES: The soft tissues are unremarkable. IMPRESSION: 1. No evidence of acute traumatic injury. Electronically signed by: Norleen Boxer MD 07/29/2024 10:44 AM EST RP Workstation: [id number]     Positive ROS: All other systems have been reviewed and were otherwise negative with the exception of those mentioned in the HPI and as above.  Physical Exam: BP (!) 149/90 (BP Location: Left Arm)   Pulse 92   Temp (!) 97.4 F (36.3 C) (Oral)   Resp 15   Ht 5' 9 (1.753 m)   Wt 78.2 kg   SpO2 100%   BMI 25.46 kg/m  General:  Alert, no acute distress Psychiatric:  Patient is competent for consent with normal mood and affect    Orthopedic Exam:  LUE: Pain to palpation about the shoulder/proximal humerus.  Patient is able to demonstrate full composite fist formation and full extension of all the digits.  He has full active range of motion of his wrist.  Sling is in place.  Sensation is intact to light touch proximally and distally.  Active or passive range of motion of the shoulder was not attempted due to the fracture.   Digits are warm and well-perfused.  Imaging:  As above: Comminuted proximal humerus fracture.  Anterior articular surface seems to be intact.  Fracture primarily about the neck with a large lateral butterfly fragment.  Assessment/Plan: Left proximal humerus fracture - Nonweightbearing to the left upper extremity - Sling immobilization - Pain control as per primary - CT left shoulder obtained - Okay for very gentle passive range of motion exercises like pendulum swings but limit much range of motion at this point - Patient can follow-up on an outpatient basis with orthopedic surgery   Jackquline CANDIE Barrack, MD Orthopaedic Surgery [facility]     [1]  Allergies Allergen Reactions   Drug Class [Ace Inhibitors] Swelling    Swelling   "

## 2024-07-29 NOTE — ED Triage Notes (Addendum)
 Pt to ED via [facility] from home. Pt had fall yesterday. No LOC but pt reports hitting head on desk. EMS reports pt has been having frequent falls due to glucose drops. Pt complains of L arm pain and R side pain.  EMS  HR 102 99% RA BP 142/78 CBG 197

## 2024-07-29 NOTE — H&P (Signed)
 " History and Physical    Micheal Wong DOB: 10/19/1970 DOA: 07/29/2024  DOS: the patient was seen and examined on 07/29/2024  PCP: Dyane Carlyon Norris, FNP   Patient coming from: Home  I have personally briefly reviewed patient's old medical records in [facility] and CareEverywhere  HPI:   Micheal Wong is a 53 y.o. year old male with medical history of hypertension, hyperlipidemia, type 2 diabetes, alcohol use disorder presenting to the ED after a fall.  Patient states he fell yesterday early in the morning.  He fell he was going to use the bathroom and states he went to pick up an item and fell backwards.  He denies any loss of consciousness.  He states he has significant pain in his chest and his shoulder and arm.  On arrival to the ED patient was noted to be HDS stable.  Lab work and imaging obtained.  CBC without leukocytosis, mild anemia at baseline.  CMP with significant hypokalemia at 3.1, elevated LFTs with AST at 51, ALT at 59, ALP at 144.  Troponin checked and elevated but downtrending.  Magnesium  checked and normal at 2.  Imaging obtained that showed acute comminuted displaced and impacted oblique fracture of the left proximal humerus.  It also showed potential acute fractures of the posterior right 12th, 9th, and 8th ribs but without any pneumothorax.  CT head obtained without any acute findings.  TRH contacted for admission.  Review of Systems: As mentioned in the history of present illness. All other systems reviewed and are negative.   Past Medical History:  Diagnosis Date   Diabetes mellitus without complication (HCC)    Hypertension    Pancreatitis     History reviewed. No pertinent surgical history.   Allergies[1]  History reviewed. No pertinent family history.  Prior to Admission medications  Medication Sig Start Date End Date Taking? Authorizing Provider  amitriptyline  (ELAVIL ) 75 MG tablet Take 75 mg by mouth at bedtime. 10/19/23   Yes [provider]  amLODipine  (NORVASC ) 10 MG tablet Take 10 mg by mouth daily.   Yes [provider]  aspirin  81 MG chewable tablet Chew 81 mg by mouth daily. 11/27/23 11/26/24 Yes [provider]  atorvastatin  (LIPITOR ) 80 MG tablet Take 1 tablet (80 mg total) by mouth at bedtime. 03/20/24 07/29/24 Yes Lenon Marien CROME, MD  fenofibrate  54 MG tablet Take 1 tablet (54 mg total) by mouth daily. 03/21/24  Yes Lenon Marien CROME, MD  gabapentin  (NEURONTIN ) 800 MG tablet Take 800 mg by mouth 3 (three) times daily.   Yes [provider]  JARDIANCE  25 MG TABS tablet Take 25 mg by mouth daily. 04/26/24  Yes [provider]  metFORMIN  (GLUCOPHAGE ) 500 MG tablet Take 500 mg by mouth daily with breakfast. Not sure of the dose   Yes [provider]  metoprolol  succinate (TOPROL -XL) 50 MG 24 hr tablet Take 50 mg by mouth daily. 06/03/22 07/29/24 Yes [provider]  omeprazole (PRILOSEC) 20 MG capsule Take 20 mg by mouth daily. 09/20/23  Yes [provider]  cyclobenzaprine  (FLEXERIL ) 10 MG tablet Take 10 mg by mouth 3 (three) times daily as needed. Patient not taking: Reported on 07/29/2024 01/31/24   [provider]  HUMALOG  KWIKPEN 100 UNIT/ML KwikPen Inject 5 Units into the skin 3 (three) times daily. Patient taking differently: Inject 12 Units into the skin 3 (three) times daily. 03/20/24   Lenon Marien CROME, MD  sitaGLIPtin (JANUVIA) 100 MG tablet Take  100 mg by mouth daily. Patient not taking: Reported on 07/29/2024 06/03/22   [provider]  spironolactone (ALDACTONE) 25 MG tablet Take 25 mg by mouth daily. Patient not taking: Reported on 07/29/2024 11/27/23 11/26/24  [provider]    Social History:  reports that he has been smoking cigarettes. He has never used smokeless tobacco. He reports current alcohol use. He reports that he does not currently use drugs.    Physical Exam: Vitals:    07/29/24 1311 07/29/24 1317 07/29/24 1459 07/29/24 2000  BP: 117/89  (!) 149/90 (!) 156/103  Pulse: (!) 103  92 (!) 108  Resp: 13  15 17   Temp:  97.7 F (36.5 C) (!) 97.4 F (36.3 C) 98.4 F (36.9 C)  TempSrc:  Oral Oral   SpO2: 100%  100% 98%  Weight:      Height:        Gen: NAD HENT: NCAT CV: normal heart sounds Lung: CTAB Abd: No TTP, normal bowel sounds MSK: Left arm in sling with pulses and movement of the extremities Neuro: alert and oriented   Labs on Admission: I have personally reviewed following labs and imaging studies  CBC: Recent Labs  Lab 07/29/24 0915  WBC 8.1  NEUTROABS 6.0  HGB 12.4*  HCT 38.2*  MCV 94.6  PLT 208   Basic Metabolic Panel: Recent Labs  Lab 07/29/24 0915  NA 140  K 3.1*  CL 101  CO2 29  GLUCOSE 146*  BUN 8  CREATININE 0.93  CALCIUM  9.1  MG 2.0   GFR: Estimated Creatinine Clearance: 91.9 mL/min (by C-G formula based on SCr of 0.93 mg/dL). Liver Function Tests: Recent Labs  Lab 07/29/24 0915  AST 51*  ALT 59*  ALKPHOS 144*  BILITOT 0.5  PROT 6.3*  ALBUMIN 3.5   No results for input(s): LIPASE, AMYLASE in the last 168 hours. No results for input(s): AMMONIA in the last 168 hours. Coagulation Profile: No results for input(s): INR, PROTIME in the last 168 hours. Cardiac Enzymes: No results for input(s): CKTOTAL, CKMB, CKMBINDEX, TROPONINI, TROPONINIHS in the last 168 hours. BNP (last 3 results) No results for input(s): BNP in the last 8760 hours. HbA1C: No results for input(s): HGBA1C in the last 72 hours. CBG: Recent Labs  Lab 07/29/24 1630 07/29/24 2002  GLUCAP 195* 328*   Lipid Profile: No results for input(s): CHOL, HDL, LDLCALC, TRIG, CHOLHDL, LDLDIRECT in the last 72 hours. Thyroid Function Tests: No results for input(s): TSH, T4TOTAL, FREET4, T3FREE, THYROIDAB in the last 72 hours. Anemia Panel: No results for input(s): VITAMINB12, FOLATE,  FERRITIN, TIBC, IRON, RETICCTPCT in the last 72 hours. Urine analysis:    Component Value Date/Time   COLORURINE STRAW (A) 10/26/2020 1816   APPEARANCEUR CLEAR (A) 10/26/2020 1816   LABSPEC >1.046 (H) 10/26/2020 1816   PHURINE 5.0 10/26/2020 1816   GLUCOSEU >=500 (A) 10/26/2020 1816   HGBUR SMALL (A) 10/26/2020 1816   BILIRUBINUR NEGATIVE 10/26/2020 1816   KETONESUR 20 (A) 10/26/2020 1816   PROTEINUR NEGATIVE 10/26/2020 1816   NITRITE NEGATIVE 10/26/2020 1816   LEUKOCYTESUR NEGATIVE 10/26/2020 1816    Radiological Exams on Admission: I have personally reviewed images CT Shoulder Left Wo Contrast Result Date: 07/29/2024 CLINICAL DATA:  Proximal humerus fracture.  Preoperative evaluation. EXAM: CT OF THE UPPER LEFT EXTREMITY WITHOUT CONTRAST TECHNIQUE: Multidetector CT imaging of the upper left extremity was performed according to the standard protocol. RADIATION DOSE REDUCTION: This exam was performed according to the departmental dose-optimization  program which includes automated exposure control, adjustment of the mA and/or kV according to patient size and/or use of iterative reconstruction technique. COMPARISON:  Earlier same day radiographs of the left humerus. FINDINGS: Bones/Joint/Cartilage Acute oblique comminuted impacted fracture of the proximal humeral shaft extending from the proximal metadiaphysis through the proximal to mid shaft. The dominant comminuted butterfly fracture fragment measures approximately 7 cm in craniocaudal extent and 2.3 cm AP and is displaced approximately 1.7 cm posteriorly relative to the distal fracture component (series 6, images 37-51 and series 2, image 90). Fracture margins extend through the lateral humeral head and neck junction, near the level of the inferior aspect of the greater tuberosity. The humeral head articular surface is intact. No dislocation. Mild glenohumeral joint osteoarthritis. No additional acute fracture identified. The  acromioclavicular joint is anatomically aligned. Ligaments Ligaments are suboptimally evaluated by CT. Muscles and Tendons Intramuscular edema surrounding the proximal humeral shaft fracture described above. Soft tissue Subcutaneous edema extending along the upper arm. No loculated fluid collection. No enlarged lymph nodes identified in the field of view. Thoracic aortic atherosclerosis. Visualized portions of the lungs are clear. IMPRESSION: 1. Acute comminuted displaced and impacted oblique fracture of the left proximal humeral shaft extending from the proximal metadiaphysis through the proximal to mid shaft, as described above. No dislocation. 2. Intramuscular and subcutaneous edema of the left upper arm surrounding the proximal humeral shaft fracture. 3.  Aortic Atherosclerosis (ICD10-I70.0). Electronically Signed   By: Harrietta Sherry M.D.   On: 07/29/2024 12:41   DG Ribs Unilateral W/Chest Right Result Date: 07/29/2024 EXAM: 1 AP VIEW XRAY OF THE RIGHT RIBS AND CHEST 07/29/2024 10:06:00 AM COMPARISON: None available. CLINICAL HISTORY: fall FINDINGS: BONES: Healed fractures of the posterior right 10th and 11th ribs with callus formation. Potential acute fractures of the posterior right 8th, 9th, and 12th ribs. LUNGS AND PLEURA: No consolidation or pulmonary edema. No pleural effusion or pneumothorax. HEART AND MEDIASTINUM: Aortic calcification. No acute abnormality of the cardiac and mediastinal silhouettes. IMPRESSION: 1. Potential acute fractures of the posterior right 12th, 9th, and 8th ribs. 2. No pneumothorax. 3. Healed fractures of the posterior right 10th and 11th ribs with callus formation. Electronically signed by: Norleen Boxer MD 07/29/2024 10:52 AM EST RP Workstation: [id number]   CT HEAD WO CONTRAST ( ) Result Date: 07/29/2024 EXAM: CT HEAD WITHOUT CONTRAST 07/29/2024 10:29:51 AM TECHNIQUE: CT of the head was performed without the administration of intravenous contrast. Automated exposure  control, iterative reconstruction, and/or weight based adjustment of the mA/kV was utilized to reduce the radiation dose to as low as reasonably achievable. COMPARISON: 03/18/2024 CLINICAL HISTORY: trauma FINDINGS: BRAIN AND VENTRICLES: No acute hemorrhage. No evidence of acute infarct. No hydrocephalus. No extra-axial collection. No mass effect or midline shift. ORBITS: No acute abnormality. SINUSES: Mucosal thickening in right sphenoid sinus. SOFT TISSUES AND SKULL: No acute soft tissue abnormality. No skull fracture. IMPRESSION: 1. No acute intracranial abnormality. 2. Mucosal thickening in the right sphenoid sinus. Electronically signed by: Evalene Coho MD 07/29/2024 10:52 AM EST RP Workstation: [id number]   DG Humerus Left Result Date: 07/29/2024 EXAM: 1 VIEW(S) XRAY OF THE LEFT HUMERUS 07/29/2024 10:06:00 AM COMPARISON: None available. CLINICAL HISTORY: fall FINDINGS: BONES AND JOINTS: Oblique comminuted fracture of proximal humeral shaft. Medial angulation of distal fracture fragment. Glenohumeral joint intact. SOFT TISSUES: The soft tissues are unremarkable. IMPRESSION: 1. Oblique comminuted fracture of the proximal humeral shaft with medial angulation of the distal fracture fragment. Electronically signed by: Norleen Boxer MD 07/29/2024  10:46 AM EST RP Workstation: [id number]   DG Elbow Complete Left Result Date: 07/29/2024 EXAM: 3 VIEW(S) XRAY OF THE LEFT ELBOW COMPARISON: None available. CLINICAL HISTORY: fall FINDINGS: BONES AND JOINTS: No acute fracture. No malalignment. SOFT TISSUES: The soft tissues are unremarkable. IMPRESSION: 1. No evidence of acute traumatic injury. Electronically signed by: Norleen Boxer MD 07/29/2024 10:44 AM EST RP Workstation: [id number]    EKG: My personal interpretation of EKG shows: pending   Assessment/Plan Principal Problem:   Comminuted fracture of left humerus Active Problems:   Essential hypertension   Type 2 diabetes mellitus with hyperlipidemia  (HCC)   Hyperlipemia   Patient with mechanical fall found to have left humerus fracture.  Surgery consulted and do not anticipate any surgical intervention this admission.  They will see the patient in consultation.  Appreciate their recommendations.  Patient endorses significant pain and states he does not feel ready to go home given his current health.  Will ensure patient has good pain control and rely more on oral pain medicines.  Use IV as needed for severe pain.  PT OT consulted.  Multiple rib fractures: Likely to require conservative management given no pneumothorax.  Pain control as above.  Will start to the incentive spirometry.  Will also consult neurosurgery for their recommendations.  Hypertension: Continue home amlodipine  and metoprolol .  Can use IV medicines as needed as patient can have some hypertension secondary to pain.  Hyperlipidemia: Continue home meds  GERD: Continue home meds transition to H2 blocker when able on outpatient basis.  Type 2 diabetes: Patient on Jardiance  and metformin .  Will start SSI here and continue his Jardiance .  Can titrate as needed.  He was previously on insulin  but that was discontinued given his hypoglycemic episode.   VTE prophylaxis:  SQ Heparin   Diet: HH Code Status:  Full Code Telemetry:  Admission status: Observation, Telemetry bed Patient is from: Home Anticipated d/c is to: Home Anticipated d/c is in: 1-2 days   Family Communication: Updated at bedside  Consults called: Orthopedic and General sugery   Severity of Illness: The appropriate patient status for this patient is OBSERVATION. Observation status is judged to be reasonable and necessary in order to provide the required intensity of service to ensure the patient's safety. The patient's presenting symptoms, physical exam findings, and initial radiographic and laboratory data in the context of their medical condition is felt to place them at decreased risk for further clinical  deterioration. Furthermore, it is anticipated that the patient will be medically stable for discharge from the hospital within 2 midnights of admission.    Morene Bathe, MD [facility] DEL. [facility]      [1]  Allergies Allergen Reactions   Drug Class [Ace Inhibitors] Swelling    Swelling   "

## 2024-07-30 ENCOUNTER — Observation Stay

## 2024-07-30 DIAGNOSIS — M25512 Pain in left shoulder: Secondary | ICD-10-CM | POA: Diagnosis present

## 2024-07-30 DIAGNOSIS — E876 Hypokalemia: Secondary | ICD-10-CM | POA: Diagnosis present

## 2024-07-30 DIAGNOSIS — S42425A Nondisplaced comminuted supracondylar fracture without intercondylar fracture of left humerus, initial encounter for closed fracture: Secondary | ICD-10-CM | POA: Diagnosis not present

## 2024-07-30 DIAGNOSIS — S2249XA Multiple fractures of ribs, unspecified side, initial encounter for closed fracture: Secondary | ICD-10-CM

## 2024-07-30 DIAGNOSIS — I110 Hypertensive heart disease with heart failure: Secondary | ICD-10-CM | POA: Diagnosis present

## 2024-07-30 DIAGNOSIS — T07XXXA Unspecified multiple injuries, initial encounter: Secondary | ICD-10-CM | POA: Diagnosis present

## 2024-07-30 DIAGNOSIS — Z79899 Other long term (current) drug therapy: Secondary | ICD-10-CM | POA: Diagnosis not present

## 2024-07-30 DIAGNOSIS — I4891 Unspecified atrial fibrillation: Secondary | ICD-10-CM | POA: Diagnosis present

## 2024-07-30 DIAGNOSIS — I10 Essential (primary) hypertension: Secondary | ICD-10-CM | POA: Diagnosis not present

## 2024-07-30 DIAGNOSIS — Z7984 Long term (current) use of oral hypoglycemic drugs: Secondary | ICD-10-CM | POA: Diagnosis not present

## 2024-07-30 DIAGNOSIS — Y92003 Bedroom of unspecified non-institutional (private) residence as the place of occurrence of the external cause: Secondary | ICD-10-CM | POA: Diagnosis not present

## 2024-07-30 DIAGNOSIS — E785 Hyperlipidemia, unspecified: Secondary | ICD-10-CM | POA: Diagnosis not present

## 2024-07-30 DIAGNOSIS — K219 Gastro-esophageal reflux disease without esophagitis: Secondary | ICD-10-CM | POA: Diagnosis present

## 2024-07-30 DIAGNOSIS — Z794 Long term (current) use of insulin: Secondary | ICD-10-CM | POA: Diagnosis not present

## 2024-07-30 DIAGNOSIS — W2203XA Walked into furniture, initial encounter: Secondary | ICD-10-CM | POA: Diagnosis present

## 2024-07-30 DIAGNOSIS — Y900 Blood alcohol level of less than 20 mg/100 ml: Secondary | ICD-10-CM | POA: Diagnosis present

## 2024-07-30 DIAGNOSIS — S42292A Other displaced fracture of upper end of left humerus, initial encounter for closed fracture: Secondary | ICD-10-CM | POA: Diagnosis present

## 2024-07-30 DIAGNOSIS — S2241XA Multiple fractures of ribs, right side, initial encounter for closed fracture: Secondary | ICD-10-CM | POA: Diagnosis present

## 2024-07-30 DIAGNOSIS — E1169 Type 2 diabetes mellitus with other specified complication: Secondary | ICD-10-CM | POA: Diagnosis present

## 2024-07-30 DIAGNOSIS — F1010 Alcohol abuse, uncomplicated: Secondary | ICD-10-CM | POA: Diagnosis present

## 2024-07-30 DIAGNOSIS — W19XXXA Unspecified fall, initial encounter: Secondary | ICD-10-CM | POA: Diagnosis not present

## 2024-07-30 DIAGNOSIS — R296 Repeated falls: Secondary | ICD-10-CM | POA: Diagnosis present

## 2024-07-30 DIAGNOSIS — Z888 Allergy status to other drugs, medicaments and biological substances status: Secondary | ICD-10-CM | POA: Diagnosis not present

## 2024-07-30 DIAGNOSIS — Z7982 Long term (current) use of aspirin: Secondary | ICD-10-CM | POA: Diagnosis not present

## 2024-07-30 DIAGNOSIS — R509 Fever, unspecified: Secondary | ICD-10-CM | POA: Diagnosis not present

## 2024-07-30 DIAGNOSIS — F17210 Nicotine dependence, cigarettes, uncomplicated: Secondary | ICD-10-CM | POA: Diagnosis present

## 2024-07-30 DIAGNOSIS — E7849 Other hyperlipidemia: Secondary | ICD-10-CM | POA: Diagnosis present

## 2024-07-30 LAB — CBC
HCT: 33.1 % — ABNORMAL LOW (ref 39.0–52.0)
Hemoglobin: 10.6 g/dL — ABNORMAL LOW (ref 13.0–17.0)
MCH: 30.4 pg (ref 26.0–34.0)
MCHC: 32 g/dL (ref 30.0–36.0)
MCV: 94.8 fL (ref 80.0–100.0)
Platelets: 157 K/uL (ref 150–400)
RBC: 3.49 MIL/uL — ABNORMAL LOW (ref 4.22–5.81)
RDW: 13.6 % (ref 11.5–15.5)
WBC: 8.7 K/uL (ref 4.0–10.5)
nRBC: 0 % (ref 0.0–0.2)

## 2024-07-30 LAB — BASIC METABOLIC PANEL WITH GFR
Anion gap: 8 (ref 5–15)
BUN: 8 mg/dL (ref 6–20)
CO2: 26 mmol/L (ref 22–32)
Calcium: 8.6 mg/dL — ABNORMAL LOW (ref 8.9–10.3)
Chloride: 102 mmol/L (ref 98–111)
Creatinine, Ser: 0.7 mg/dL (ref 0.61–1.24)
GFR, Estimated: >60 mL/min
Glucose, Bld: 186 mg/dL — ABNORMAL HIGH (ref 70–99)
Potassium: 3.9 mmol/L (ref 3.5–5.1)
Sodium: 137 mmol/L (ref 135–145)

## 2024-07-30 LAB — GLUCOSE, CAPILLARY
Glucose-Capillary: 188 mg/dL — ABNORMAL HIGH (ref 70–99)
Glucose-Capillary: 169 mg/dL — ABNORMAL HIGH (ref 70–99)
Glucose-Capillary: 157 mg/dL — ABNORMAL HIGH (ref 70–99)
Glucose-Capillary: 203 mg/dL — ABNORMAL HIGH (ref 70–99)

## 2024-07-30 MED ORDER — INSULIN ASPART 100 UNIT/ML IJ SOLN
3.0000 [IU] | Freq: Three times a day (TID) | INTRAMUSCULAR | Status: DC
  Administered 2024-07-30 – 2024-07-31 (×3): 3 [IU] via SUBCUTANEOUS
  Filled 2024-07-30 (×3): qty 3

## 2024-07-30 MED ORDER — FE FUM-VIT C-VIT B12-FA 460-60-0.01-1 MG PO CAPS
1.0000 | ORAL_CAPSULE | Freq: Two times a day (BID) | ORAL | Status: DC
  Administered 2024-07-30 – 2024-07-31 (×2): 1 via ORAL
  Filled 2024-07-30 (×4): qty 1

## 2024-07-30 NOTE — TOC Initial Note (Signed)
 Transition of Care [facility]) - Initial/Assessment Note    Patient Details  Name: Micheal Wong MRN: [id number] Date of Birth: 1970-10-13  Transition of Care [facility]) CM/SW Contact:    Daved JONETTA Hamilton, RN Phone Number: 07/30/2024, 3:59 PM  Clinical Narrative:                  Met with patient, introduced self and explained role. At the time of this encounter, PT/OT Evals were pending. Discussed with patient prior to this hospital admission he is independent in ADL's to include driving. Patient verbalized he has friends and family that checks in on him from time to time. Patient verbalized he lives alone in an apartment. Patient verbalized he needs FMLA documents completed. Discussed with patient he will need to reach out to his PCP for document assistance such as FMLA, patient verbalized understanding.  Discussed with patient pending therapy recommendations and possible outcomes, patient verbalized understanding that if therapy recommends SNF, HH, or OP therapy I will follow back up with him for choice.  Per therapy recommendations, patient is to Follow physician's recommendations for discharge plan and follow up therapies. This CM contacted hospital attending and Ortho physician via [facility] for further instruction. Per Ortho physician, No need for HH, Gentle ROM/ pendulum swings. Follow up outpatient.   TOC signing off    Barriers to Discharge: Continued Medical Work up   Patient Goals and CMS Choice            Expected Discharge Plan and Services                                              Prior Living Arrangements/Services     Patient language and need for interpreter reviewed:: Yes        Need for Family Participation in Patient Care: No (Comment) Care giver support system in place?: Yes (comment)   Criminal Activity/Legal Involvement Pertinent to Current Situation/Hospitalization: No - Comment as needed  Activities of Daily Living   ADL Screening (condition at  time of admission) Independently performs ADLs?: Yes (appropriate for developmental age) Is the patient deaf or have difficulty hearing?: No Does the patient have difficulty seeing, even when wearing glasses/contacts?: No Does the patient have difficulty concentrating, remembering, or making decisions?: No  Permission Sought/Granted                  Emotional Assessment Appearance:: Appears stated age, Well-Groomed Attitude/Demeanor/Rapport: Engaged Affect (typically observed): Appropriate Orientation: : Oriented to Self, Oriented to Place, Oriented to  Time, Oriented to Situation Alcohol / Substance Use: Not Applicable Psych Involvement: No (comment)  Admission diagnosis:  Multiple fractures [T07.XXXA] Fall, initial encounter [id number]] Multiple fractures of ribs, right side, initial encounter for closed fracture [S22.41XA] Closed fracture of proximal end of left humerus, unspecified fracture morphology, initial encounter [S42.202A] Patient Active Problem List   Diagnosis Date Noted   Multiple fractures 07/30/2024   Multiple rib fractures 07/30/2024   GERD (gastroesophageal reflux disease) 07/30/2024   Comminuted fracture of left humerus 07/29/2024   Anal lesion 02/05/2024   Hypoglycemia 02/04/2024   Dyslipidemia 02/04/2024   Type 2 diabetes mellitus with peripheral neuropathy (HCC) 02/04/2024   Hyperglycemia    Atrial fibrillation with rapid ventricular response (HCC)    Acute alcoholic pancreatitis 10/26/2020   Essential hypertension    Pancreatitis 02/27/2020   Type 2 diabetes  mellitus with hyperlipidemia (HCC) 02/27/2020   Hyperlipemia 02/27/2020   Dehydration 02/27/2020   Pancreatitis, recurrent 02/27/2020   ETOH abuse 02/27/2020   Tobacco abuse 02/27/2020   Hypokalemia 02/27/2020   Hyponatremia 02/27/2020   PCP:  Dyane Carlyon Norris, FNP Pharmacy:   [id number] [place], [place] - [address] AT [facility] OF [address] [place] [address] Phone: [phone] Fax: [phone]  [facility] [address]), [place] - [address] [place]) [place] [address] Phone: [phone] Fax: [phone]     Social Drivers of Health (SDOH) Social History: SDOH Screenings   Food Insecurity: No Food Insecurity (07/29/2024)  Housing: Low Risk (07/29/2024)  Transportation Needs: No Transportation Needs (07/29/2024)  Utilities: Not At Risk (07/29/2024)  Financial Resource Strain: Medium Risk (08/03/2023)   Received from [facility]  Physical Activity: Inactive (08/03/2023)   Received from [facility]  Social Connections: Moderately Isolated (03/18/2024)  Stress: Stress Concern Present (08/03/2023)   Received from [facility]  Tobacco Use: High Risk (07/29/2024)  Health Literacy: Adequate Health Literacy (08/03/2023)   Received from [facility]   SDOH Interventions: Social Connections Interventions: Inpatient TOC, Other (Comment) (Resources added to AVS)   Readmission Risk Interventions     No data to display

## 2024-07-30 NOTE — Discharge Instructions (Signed)

## 2024-07-30 NOTE — Evaluation (Signed)
 Occupational Therapy Evaluation Patient Details Name: Micheal Wong MRN: [id number] DOB: May 14, 1971 Today's Date: 07/30/2024   History of Present Illness   53 y.o. male with a past medical history of diabetes, hypertension, alcoholic pancreatitis, congestive heart failure, A-fib who presents with left shoulder pain after a fall.  He had a mechanical ground-level fall yesterday morning noting immediate and significant left shoulder pain afterwards.  He was found to have a left proximal humerus fracture and orthopedics has been consulted for this injury.  Work up also revealed right ribs fracture of the 12th rib.  Also fracture of the 9th and potential fracture of the 8th.     Clinical Impressions Pt was seen for OT evaluation this date. Prior to hospital admission, pt was independent and working, living by himself in a 2nd floor apartment. Pt presents with deficits in L shoulder pain, R ribs pain, impaired balance, functional LUE use, and decr knowledge of sling and precautions limiting their ability to perform ADL management at baseline level. Pt currently requires MAX A for sling mgt, MOD A for UB ADL, MIN-MOD A for LB ADL, and CGA for limited step pivot to the recliner. Pt very pain limited this date. Pt reports having a friend he can stay with for additional support but she lives in a 3rd fl apartment. Pt would benefit from skilled OT services to address noted impairments and functional limitations (see below for any additional details) in order to maximize safety and independence while minimizing future risk of falls, injury, and readmission. Anticipate the need for follow up therapy for shoulder per ortho MD upon acute hospital DC.    If plan is discharge home, recommend the following:   A little help with walking and/or transfers;A lot of help with bathing/dressing/bathroom;Assist for transportation;Assistance with cooking/housework;Help with stairs or ramp for entrance     Functional  Status Assessment   Patient has had a recent decline in their functional status and demonstrates the ability to make significant improvements in function in a reasonable and predictable amount of time.     Equipment Recommendations   None recommended by OT     Recommendations for Other Services         Precautions/Restrictions   Precautions Precautions: Sternal;Shoulder Type of Shoulder Precautions: no WB, no ROM Shoulder Interventions: Shoulder sling/immobilizer;At all times Precaution Booklet Issued: No Recall of Precautions/Restrictions: Impaired Restrictions Weight Bearing Restrictions Per Provider Order: Yes LUE Weight Bearing Per Provider Order: Non weight bearing     Mobility Bed Mobility Overal bed mobility: Needs Assistance Bed Mobility: Supine to Sit     Supine to sit: Mod assist     General bed mobility comments: pain limited    Transfers Overall transfer level: Needs assistance Equipment used: None Transfers: Sit to/from Stand, Bed to chair/wheelchair/BSC Sit to Stand: Contact guard assist, Min assist     Step pivot transfers: Contact guard assist            Balance Overall balance assessment: Needs assistance Sitting-balance support: No upper extremity supported, Feet supported Sitting balance-Leahy Scale: Fair     Standing balance support: No upper extremity supported Standing balance-Leahy Scale: Fair         ADL either performed or assessed with clinical judgement   ADL Overall ADL's : Needs assistance/impaired                 Upper Body Dressing : Sitting;Moderate assistance;Maximal assistance Upper Body Dressing Details (indicate cue type and reason): MAX A sling  Lower Body Dressing: Sit to/from stand;Minimal assistance Lower Body Dressing Details (indicate cue type and reason): socks             Functional mobility during ADLs: Contact guard assist        Pertinent Vitals/Pain Pain Assessment Pain  Assessment: 0-10 Pain Score: 9  Pain Location: L shoulder, R ribs Pain Descriptors / Indicators: Aching, Grimacing, Guarding Pain Intervention(s): Limited activity within patient's tolerance, Monitored during session, Premedicated before session, Repositioned, Patient requesting pain meds-RN notified, RN gave pain meds during session     Extremity/Trunk Assessment Upper Extremity Assessment Upper Extremity Assessment: Right hand dominant;LUE deficits/detail LUE: Unable to fully assess due to pain;Unable to fully assess due to immobilization;Shoulder pain at rest LUE Sensation: WNL LUE Coordination: decreased fine motor;decreased gross motor   Lower Extremity Assessment Lower Extremity Assessment: Defer to PT evaluation       Communication Communication Communication: No apparent difficulties   Cognition Arousal: Alert, Lethargic   Cognition: No family/caregiver present to determine baseline     Following commands: Intact       Cueing  General Comments   Cueing Techniques: Verbal cues      Exercises Other Exercises Other Exercises: Pt edu in NWBing, shoulder restrictions, sling mgt   Shoulder Instructions      Home Living Family/patient expects to be discharged to:: Private residence Living Arrangements: Alone Available Help at Discharge: Friend(s);Available PRN/intermittently Type of Home: Apartment Home Access: Stairs to enter Entrance Stairs-Number of Steps: 2nd fl apt, no elevator   Home Layout: One level                   Additional Comments: friend lives on 3rd floor of different apt, still no elevator      Prior Functioning/Environment Prior Level of Function : Independent/Modified Independent;Driving;Working/employed        OT Problem List: Pain;Decreased strength;Decreased range of motion;Decreased safety awareness;Impaired balance (sitting and/or standing);Impaired UE functional use;Decreased knowledge of precautions   OT  Treatment/Interventions: Self-care/ADL training;Therapeutic exercise;Therapeutic activities;DME and/or AE instruction;Patient/family education;Balance training      OT Goals(Current goals can be found in the care plan section)   Acute Rehab OT Goals Patient Stated Goal: have less pain and get better OT Goal Formulation: With patient Time For Goal Achievement: 08/13/24 Potential to Achieve Goals: Good ADL Goals Pt Will Perform Upper Body Dressing: with modified independence;sitting Pt Will Perform Lower Body Dressing: with modified independence;sit to/from stand Pt Will Transfer to Toilet: with modified independence;ambulating (LRAD) Pt Will Perform Toileting - Clothing Manipulation and hygiene: sit to/from stand;sitting/lateral leans;with modified independence Additional ADL Goal #1: Pt will indep instruct family/caregiver in shoulder sling mgt   OT Frequency:  Min 2X/week    Co-evaluation              AM-PAC OT 6 Clicks Daily Activity     Outcome Measure Help from another person eating meals?: None Help from another person taking care of personal grooming?: A Little Help from another person toileting, which includes using toliet, bedpan, or urinal?: A Little Help from another person bathing (including washing, rinsing, drying)?: A Little Help from another person to put on and taking off regular upper body clothing?: A Lot Help from another person to put on and taking off regular lower body clothing?: A Lot 6 Click Score: 17   End of Session    Activity Tolerance: Patient tolerated treatment well Patient left: in chair;with call bell/phone within reach;with chair alarm set;with nursing/sitter  in room  OT Visit Diagnosis: Other abnormalities of gait and mobility (R26.89);History of falling (Z91.81);Pain Pain - Right/Left: Right Pain - part of body: Shoulder;Arm                Time: 9098-9071 OT Time Calculation (min): 27 min Charges:  OT General Charges $OT Visit: 1  Visit OT Evaluation $OT Eval Low Complexity: 1 Low OT Treatments $Self Care/Home Management : 8-22 mins  Warren SAUNDERS., MPH, MS, OTR/L ascom [phone] 07/30/2024, 11:08 AM

## 2024-07-30 NOTE — Consult Note (Signed)
 "  SURGICAL CONSULTATION NOTE   HISTORY OF PRESENT ILLNESS (HPI):  53 y.o. male presented to [facility] ED for evaluation of fall 2 days ago. Patient reports he fell at home 2 days ago.  He was admitted yesterday due to multiple fractures.  He endorses that he hit his head against a dresser but denies loss of consciousness.  He has been having pain in multiple extremities including left upper extremity, chest and head.  No pain radiation.  Pain aggravated by certain movement of the chest.  Denies shortness of breath.  Aggravating factor has been pain medications.  Denies any nausea or vomiting.  Denies abdominal pain.    At the ER he was found with stable vital signs, alert and oriented.  Labs without leukocytosis with hemoglobin of 12.4.  Physical exam with tenderness on palpation in the right chest and left shoulder.  He had a chest x-rays including dedicated right ribs x-rays with fracture of the 12th rib.  Also fracture of the 9 and potential fracture of the eighth.  No pneumothorax is seen.  There are also chronic fractures of right posterior ribs 10 and 11th.  I personally evaluated the images.  Surgery is consulted by Dr. Fernand in this context for evaluation and management of fractures.  PAST MEDICAL HISTORY (PMH):  Past Medical History:  Diagnosis Date   Diabetes mellitus without complication (HCC)    Hypertension    Pancreatitis      PAST SURGICAL HISTORY (PSH):  History reviewed. No pertinent surgical history.   MEDICATIONS:  Prior to Admission medications  Medication Sig Start Date End Date Taking? Authorizing Provider  amitriptyline  (ELAVIL ) 75 MG tablet Take 75 mg by mouth at bedtime. 10/19/23  Yes [provider]  amLODipine  (NORVASC ) 10 MG tablet Take 10 mg by mouth daily.   Yes [provider]  aspirin  81 MG chewable tablet Chew 81 mg by mouth daily. 11/27/23 11/26/24 Yes [provider]  atorvastatin  (LIPITOR ) 80 MG tablet Take 1 tablet (80 mg total) by  mouth at bedtime. 03/20/24 07/29/24 Yes Lenon Marien CROME, MD  fenofibrate  54 MG tablet Take 1 tablet (54 mg total) by mouth daily. 03/21/24  Yes Lenon Marien CROME, MD  gabapentin  (NEURONTIN ) 800 MG tablet Take 800 mg by mouth 3 (three) times daily.   Yes [provider]  JARDIANCE  25 MG TABS tablet Take 25 mg by mouth daily. 04/26/24  Yes [provider]  metFORMIN  (GLUCOPHAGE ) 500 MG tablet Take 500 mg by mouth daily with breakfast. Not sure of the dose   Yes [provider]  metoprolol  succinate (TOPROL -XL) 50 MG 24 hr tablet Take 50 mg by mouth daily. 06/03/22 07/29/24 Yes [provider]  omeprazole (PRILOSEC) 20 MG capsule Take 20 mg by mouth daily. 09/20/23  Yes [provider]  cyclobenzaprine  (FLEXERIL ) 10 MG tablet Take 10 mg by mouth 3 (three) times daily as needed. Patient not taking: Reported on 07/29/2024 01/31/24   [provider]  HUMALOG  KWIKPEN 100 UNIT/ML KwikPen Inject 5 Units into the skin 3 (three) times daily. Patient taking differently: Inject 12 Units into the skin 3 (three) times daily. 03/20/24   Lenon Marien CROME, MD  sitaGLIPtin (JANUVIA) 100 MG tablet Take 100 mg by mouth daily. Patient not taking: Reported on 07/29/2024 06/03/22   [provider]  spironolactone (ALDACTONE) 25 MG tablet Take 25 mg by mouth daily. Patient not taking: Reported on 07/29/2024 11/27/23 11/26/24  [provider]  ALLERGIES:  Allergies[1]   SOCIAL HISTORY:  Social History   Socioeconomic History   Marital status: Unknown    Spouse name: Not on file   Number of children: Not on file   Years of education: Not on file   Highest education level: Not on file  Occupational History   Not on file  Tobacco Use   Smoking status: Every Day    Current packs/day: 0.50    Types: Cigarettes   Smokeless tobacco: Never  Substance and Sexual Activity   Alcohol use: Yes    Comment: occasionally   Drug use: Not  Currently   Sexual activity: Not on file  Other Topics Concern   Not on file  Social History Narrative   Not on file   Social Drivers of Health   Tobacco Use: High Risk (07/29/2024)   Patient History    Smoking Tobacco Use: Every Day    Smokeless Tobacco Use: Never    Passive Exposure: Not on file  Financial Resource Strain: Medium Risk (08/03/2023)   Received from [facility]   Overall Financial Resource Strain (CARDIA)    Difficulty of Paying Living Expenses: Somewhat hard  Food Insecurity: No Food Insecurity (07/29/2024)   Epic    Worried About Running Out of Food in the Last Year: Never true    Ran Out of Food in the Last Year: Never true  Transportation Needs: No Transportation Needs (07/29/2024)   Epic    Lack of Transportation (Medical): No    Lack of Transportation (Non-Medical): No  Physical Activity: Inactive (08/03/2023)   Received from [facility]   Exercise Vital Sign    On average, how many minutes do you engage in exercise at this level?: 0 min    On average, how many days per week do you engage in moderate to strenuous exercise (like a brisk walk)?: 0 days  Stress: Stress Concern Present (08/03/2023)   Received from [facility] - Occupational Stress Questionnaire    Feeling of Stress : To some extent  Social Connections: Moderately Isolated (03/18/2024)   Social Connection and Isolation Panel    Frequency of Communication with Friends and Family: More than three times a week    Frequency of Social Gatherings with Friends and Family: More than three times a week    Attends Religious Services: More than 4 times per year    Active Member of [employer] or Organizations: No    Attends Banker Meetings: Never    Marital Status: Widowed  Intimate Partner Violence: Not At Risk (07/29/2024)   Epic    Fear of Current or Ex-Partner: No    Emotionally Abused: No     Physically Abused: No    Sexually Abused: No  Depression (PHQ2-9): Not on file  Alcohol Screen: Not on file  Housing: Low Risk (07/29/2024)   Epic    Unable to Pay for Housing in the Last Year: No    Number of Times Moved in the Last Year: 0    Homeless in the Last Year: No  Utilities: Not At Risk (07/29/2024)   Epic    Threatened with loss of utilities: No  Health Literacy: Adequate Health Literacy (08/03/2023)   Received from [facility]   B1300 Health Literacy    Frequency of need for help with medical instructions: Never      FAMILY HISTORY:  History reviewed. No pertinent  family history.   REVIEW OF SYSTEMS:  Constitutional: denies weight loss, fever, chills, or sweats  Eyes: denies any other vision changes, history of eye injury  ENT: denies sore throat, hearing problems  Respiratory: denies shortness of breath, wheezing  Cardiovascular: Positive chest pain Gastrointestinal: Denies abdominal pain, nausea and vomiting Genitourinary: denies burning with urination or urinary frequency Musculoskeletal: denies any other joint pains or cramps  Skin: denies any other rashes or skin discolorations  Neurological: denies any other headache, dizziness, weakness  Psychiatric: denies any other depression, anxiety   All other review of systems were negative   VITAL SIGNS:  Temp:  [97.4 F (36.3 C)-98.4 F (36.9 C)] 98.4 F (36.9 C) (12/22 2000) Pulse Rate:  [92-108] 108 (12/22 2000) Resp:  [11-18] 17 (12/22 2000) BP: (117-156)/(78-103) 130/90 (12/22 2200) SpO2:  [98 %-100 %] 98 % (12/22 2000) Weight:  [78.2 kg] 78.2 kg (12/22 0905)     Height: 5' 9 (175.3 cm) Weight: 78.2 kg BMI (Calculated): 25.45   INTAKE/OUTPUT:  This shift: Total I/O In: 240 [P.O.:240] Out: -   Last 2 shifts: @IOLAST2SHIFTS @   PHYSICAL EXAM:  Constitutional:  -- Normal body habitus  -- Awake, alert, and oriented x3  Eyes:  -- Pupils equally round and reactive to light  -- No  scleral icterus  Ear, nose, and throat:  -- No jugular venous distension  Chest: -- Tender to palpation in the right lower back and right costal margin.  No flail chest. Pulmonary:  -- No crackles  -- Equal breath sounds bilaterally -- Breathing non-labored at rest Cardiovascular:  -- S1, S2 present  -- No pericardial rubs Gastrointestinal:  -- Abdomen soft, nontender, non-distended, no guarding or rebound tenderness Musculoskeletal and Integumentary:  -- Wounds: None appreciated -- Extremities: B/L UE and LE without any discoloration or significant swelling. Neurologic:  -- Motor function: intact and symmetric -- Sensation: intact and symmetric   Labs:     Latest Ref Rng & Units 07/29/2024    9:15 AM 03/20/2024    4:42 AM 03/18/2024    9:32 AM  CBC  WBC 4.0 - 10.5 K/uL 8.1  5.3  6.9   Hemoglobin 13.0 - 17.0 g/dL 87.5  87.8  87.4   Hematocrit 39.0 - 52.0 % 38.2  35.7  36.5   Platelets 150 - 400 K/uL 208  118  120       Latest Ref Rng & Units 07/29/2024    9:15 AM 03/20/2024   10:42 AM 03/20/2024    4:42 AM  CMP  Glucose 70 - 99 mg/dL 853  884  811   BUN 6 - 20 mg/dL 8  <5  <5   Creatinine 0.61 - 1.24 mg/dL 9.06  9.18  9.08   Sodium 135 - 145 mmol/L 140  138  136   Potassium 3.5 - 5.1 mmol/L 3.1  4.4  5.5   Chloride 98 - 111 mmol/L 101  103  103   CO2 22 - 32 mmol/L 29  27  27    Calcium  8.9 - 10.3 mg/dL 9.1  8.7  8.5   Total Protein 6.5 - 8.1 g/dL 6.3     Total Bilirubin 0.0 - 1.2 mg/dL 0.5     Alkaline Phos 38 - 126 U/L 144     AST 15 - 41 U/L 51     ALT 0 - 44 U/L 59       Imaging studies:  EXAM: 1 AP VIEW XRAY OF THE RIGHT  RIBS AND CHEST 07/29/2024 10:06:00 AM   COMPARISON: None available.   CLINICAL HISTORY: fall   FINDINGS:   BONES: Healed fractures of the posterior right 10th and 11th ribs with callus formation. Potential acute fractures of the posterior right 8th, 9th, and 12th ribs.   LUNGS AND PLEURA: No consolidation or pulmonary edema.  No pleural effusion or pneumothorax.   HEART AND MEDIASTINUM: Aortic calcification. No acute abnormality of the cardiac and mediastinal silhouettes.   IMPRESSION: 1. Potential acute fractures of the posterior right 12th, 9th, and 8th ribs. 2. No pneumothorax. 3. Healed fractures of the posterior right 10th and 11th ribs with callus formation.   Electronically signed by: Norleen Boxer MD 07/29/2024 10:52 AM EST RP Workstation: [id number]  Assessment/Plan:  53 y.o. male with 3 right rib fractures, complicated by pertinent comorbidities including hypertension, hyperlipidemia, type 2 diabetes.  Acute fracture of posterior right 12th, 9th and 8th ribs - No sign of flail chest - No pneumothorax on x-ray - Agree with current pain management - Perform incentive spirometer - Pulmonary toileting - No further recommendations - No surgical follow-up needed - General Surgery will sign off  Left humerus fracture - Patient evaluated by orthopedic surgery - Follow-up orthopedic recommendations  Hypertension - Continue home meds - Continue management as per primary team  Type 2 diabetes mellitus - Continue management as primary team   Lucas Petrin, MD     [1]  Allergies Allergen Reactions   Drug Class [Ace Inhibitors] Swelling    Swelling   "

## 2024-07-30 NOTE — Evaluation (Signed)
 Physical Therapy Evaluation Patient Details Name: Micheal Wong MRN: [id number] DOB: 1970/12/23 Today's Date: 07/30/2024  History of Present Illness  53 y.o. male with a past medical history of diabetes, hypertension, alcoholic pancreatitis, congestive heart failure, A-fib who presents with left shoulder pain after a fall.  He had a mechanical ground-level fall yesterday morning noting immediate and significant left shoulder pain afterwards.  He was found to have a left proximal humerus fracture and orthopedics has been consulted for this injury.  Work up also revealed right ribs fracture of the 12th rib.  Also fracture of the 9th and potential fracture of the 8th.   Clinical Impression  Pt received seated in recliner upon arrival to room and pt agreeable to therapy.  Pt able to perform transfer without any significant issues, however noted some mild unsteadiness when standing.  Pt then ambulated around the nursing station and into the stairwell where pt performed stair training.  When ambulating, pt with slight unsteadiness and required minA at times to remain upright and to steady.  Pt also advised to be careful of the door frames and pt ran into it with the injured shoulder.  Pt noted he was fine, but advised to be more careful of his surroundings.  Pt performed stair training and noted that the stairs at his apartment are more difficult to navigate due to the handrail not being something that is easy to grab a hold of.  Pt was still able to perform with rail, but noted to have significant unsteadiness that required minA again to prevent pt from having uncontrolled descent.  Pt then transitioned to the room and into the bed with pillows utilized for comfort of the shoulder, head, and ribs.  SABRAjwrend        If plan is discharge home, recommend the following: A little help with walking and/or transfers;A little help with bathing/dressing/bathroom;Assist for transportation;Help with stairs or ramp for  entrance   Can travel by private vehicle        Equipment Recommendations Cane  Recommendations for Other Services       Functional Status Assessment Patient has had a recent decline in their functional status and demonstrates the ability to make significant improvements in function in a reasonable and predictable amount of time.     Precautions / Restrictions Precautions Precautions: Sternal;Shoulder Type of Shoulder Precautions: no WB, no ROM Shoulder Interventions: Shoulder sling/immobilizer;At all times Precaution Booklet Issued: No Recall of Precautions/Restrictions: Impaired Restrictions Weight Bearing Restrictions Per Provider Order: Yes LUE Weight Bearing Per Provider Order: Non weight bearing      Mobility  Bed Mobility Overal bed mobility: Needs Assistance Bed Mobility: Sit to Supine       Sit to supine: Supervision   General bed mobility comments: pt in recliner upon arrival, and returned to bed with supervision and cues for returning to supine.    Transfers Overall transfer level: Needs assistance Equipment used: None Transfers: Sit to/from Stand Sit to Stand: Contact guard assist, Min assist                Ambulation/Gait Ambulation/Gait assistance: Contact guard assist, Min assist Gait Distance (Feet): 200 Feet Assistive device: None Gait Pattern/deviations: Step-through pattern, Staggering left, Staggering right, Drifts right/left Gait velocity: decreased     General Gait Details: Pt at times staggers to each side and is unaware of his surroundins, running his L shoulder into the door frame when exiting the room.  Stairs Stairs: Yes Stairs assistance: Contact guard assist,  Min assist Stair Management: One rail Left, Alternating pattern, Step to pattern, Forwards, Sideways Number of Stairs: 10 General stair comments: Pt performed with use of the railing on the R going up, due to instability when attempting to ambulatin with railing on  the L.  Pt then given instruciton on performing lateral steps to keep support in front of him.  Wheelchair Mobility     Tilt Bed    Modified Rankin (Stroke Patients Only)       Balance Overall balance assessment: Needs assistance Sitting-balance support: No upper extremity supported, Feet supported Sitting balance-Leahy Scale: Fair     Standing balance support: No upper extremity supported Standing balance-Leahy Scale: Fair                               Pertinent Vitals/Pain Pain Assessment Pain Assessment: 0-10 Pain Score: 9  Pain Location: L shoulder, R ribs Pain Descriptors / Indicators: Aching, Grimacing, Guarding Pain Intervention(s): Limited activity within patient's tolerance, Monitored during session, Premedicated before session, Repositioned    Home Living Family/patient expects to be discharged to:: Private residence Living Arrangements: Alone Available Help at Discharge: Friend(s);Available PRN/intermittently Type of Home: Apartment Home Access: Stairs to enter   Entrance Stairs-Number of Steps: 2nd fl apt, no elevator   Home Layout: One level   Additional Comments: friend lives on 3rd floor of different apt, still no elevator    Prior Function Prior Level of Function : Independent/Modified Independent;Driving;Working/employed                     Extremity/Trunk Assessment   Upper Extremity Assessment Upper Extremity Assessment: Right hand dominant;LUE deficits/detail LUE Deficits / Details: NWB on L UE LUE: Unable to fully assess due to pain;Unable to fully assess due to immobilization;Shoulder pain at rest LUE Sensation: WNL LUE Coordination: decreased fine motor;decreased gross motor    Lower Extremity Assessment Lower Extremity Assessment: Generalized weakness       Communication   Communication Communication: No apparent difficulties    Cognition Arousal: Alert, Lethargic                                Following commands: Intact       Cueing Cueing Techniques: Verbal cues     General Comments      Exercises Other Exercises Other Exercises: Pt edu in NWBing, shoulder restrictions, sling mgt   Assessment/Plan    PT Assessment Patient needs continued PT services  PT Problem List Decreased strength;Decreased range of motion;Decreased activity tolerance;Decreased balance;Decreased mobility;Pain;Decreased knowledge of use of DME;Decreased safety awareness       PT Treatment Interventions DME instruction;Gait training;Stair training;Functional mobility training;Therapeutic activities;Therapeutic exercise;Balance training;Neuromuscular re-education    PT Goals (Current goals can be found in the Care Plan section)  Acute Rehab PT Goals Patient Stated Goal: to be able to go home. PT Goal Formulation: With patient Time For Goal Achievement: 08/13/24 Potential to Achieve Goals: Good    Frequency Min 2X/week     Co-evaluation               AM-PAC PT 6 Clicks Mobility  Outcome Measure Help needed turning from your back to your side while in a flat bed without using bedrails?: A Little Help needed moving from lying on your back to sitting on the side of a flat bed without using bedrails?: A  Little Help needed moving to and from a bed to a chair (including a wheelchair)?: A Little Help needed standing up from a chair using your arms (e.g., wheelchair or bedside chair)?: A Little Help needed to walk in hospital room?: A Little Help needed climbing 3-5 steps with a railing? : A Lot 6 Click Score: 17    End of Session Equipment Utilized During Treatment: Other (comment) (L arm sling) Activity Tolerance: Patient tolerated treatment well Patient left: in bed;with call bell/phone within reach;with bed alarm set Nurse Communication: Mobility status PT Visit Diagnosis: Unsteadiness on feet (R26.81);Other abnormalities of gait and mobility (R26.89);Muscle weakness (generalized)  (M62.81);Difficulty in walking, not elsewhere classified (R26.2);Pain Pain - Right/Left: Left Pain - part of body: Shoulder;Arm    Time: 8896-8882 PT Time Calculation (min) (ACUTE ONLY): 14 min   Charges:   PT Evaluation $PT Eval Low Complexity: 1 Low   PT General Charges $$ ACUTE PT VISIT: 1 Visit         Fonda Simpers, PT, DPT Physical Therapist - [facility]  [facility]  07/30/2024, 2:40 PM

## 2024-07-30 NOTE — Plan of Care (Signed)
  Problem: Clinical Measurements: Goal: Respiratory complications will improve Outcome: Progressing   Problem: Activity: Goal: Risk for activity intolerance will decrease Outcome: Progressing   Problem: Nutrition: Goal: Adequate nutrition will be maintained Outcome: Progressing   Problem: Elimination: Goal: Will not experience complications related to bowel motility Outcome: Progressing Goal: Will not experience complications related to urinary retention Outcome: Progressing   Problem: Pain Managment: Goal: General experience of comfort will improve and/or be controlled Outcome: Progressing

## 2024-07-30 NOTE — Consult Note (Incomplete)
 SABRA

## 2024-07-30 NOTE — Assessment & Plan Note (Signed)
-   Continue home fenofibrate  and Lipitor

## 2024-07-30 NOTE — Assessment & Plan Note (Signed)
-

## 2024-07-30 NOTE — Progress Notes (Signed)
" °  Progress Note   Patient: Micheal Wong FMW:[id number] DOB: [birth year] DOA: 07/29/2024     0 DOS: the patient was seen and examined on 07/30/2024   Brief hospital course: Partly taken from H&P.  Micheal Wong is a 53 y.o. year old male with medical history of hypertension, hyperlipidemia, type 2 diabetes, alcohol use disorder presenting to the ED after a mechanical fall.  No loss of consciousness, developed significant pain in his chest and left shoulder afterwards.  On presentation hemodynamically stable, labs with mild hypokalemia at 3.1, mildly elevated liver enzymes with AST at 51, ALT 59, ALP 144, mildly elevated troponin with downward trend.  Magnesium  normal.  Imaging shows acute comminuted displaced and impacted oblique fracture of left proximal humerus.  Also shows acute fractures of posterior right 8th, 9th and 12th ribs, no pneumothorax.  CT head was negative for any acute abnormality.  Orthopedic surgery was consulted and they advised conservative management with left upper extremity being nonweightbearing, sling immobilization and pain control.  12/23: Vital stable, hemoglobin with some decreased to 10.6, hypokalemia resolved with potassium at 3.9, CBG elevated at 188. Patient likely needs some surgical intervention as outpatient per orthopedic surgery.  Assessment and Plan: * Comminuted fracture of left humerus Multiple rib fractures involving right 8th, 9th and 12th rib. Secondary to mechanical fall.  Orthopedic surgery is on board and advising conservative management with sling and outpatient follow-up-likely will need some surgical intervention down the road by shoulder specialist. - Continue with pain management -Left upper extremity remained in sling and nonweightbearing. - PT/OT evaluation -Incentive spirometry and flutter valve for rib fractures  Essential hypertension Blood pressure currently within goal. - Continue home amlodipine  and metoprolol   Type 2 diabetes  mellitus with hyperlipidemia (HCC) CBC mildly elevated, -Adding 3 units with meal -Continue with SSI - Continue home Jardiance   Hyperlipemia - Continue home fenofibrate  and Lipitor   GERD (gastroesophageal reflux disease) - Continue Protonix    Subjective: Patient was seen and examined today.  Having pain with left upper extremity movement.  Patient is trying to get some help at home through family and friends.  Physical Exam: Vitals:   07/29/24 2000 07/29/24 2200 07/30/24 0423 07/30/24 0751  BP: (!) 156/103 (!) 130/90 (!) 140/88 (!) 133/92  Pulse: (!) 108  97 89  Resp: 17  17 18   Temp: 98.4 F (36.9 C)  98.6 F (37 C) 97.7 F (36.5 C)  TempSrc:   Oral   SpO2: 98%  94% 98%  Weight:   81.4 kg   Height:       General.  Well-developed gentleman, in no acute distress. Pulmonary.  Lungs clear bilaterally, normal respiratory effort. CV.  Regular rate and rhythm, no JVD, rub or murmur. Abdomen.  Soft, nontender, nondistended, BS positive. CNS.  Alert and oriented .  No focal neurologic deficit. Extremities.  No edema,pulses intact and symmetrical.  Left upper extremity in sling. Psychiatry.  Judgment and insight appears normal.   Data Reviewed: Prior data reviewed  Family Communication: Discussed with patient  Disposition: Status is: Inpatient Remains inpatient appropriate because: Severity of illness  Planned Discharge Destination: Home  DVT prophylaxis.  Subcu heparin  Time spent: 50 minutes  This record has been created using Conservation officer, historic buildings. Errors have been sought and corrected,but may not always be located. Such creation errors do not reflect on the standard of care.   Author: Amaryllis Dare, MD 07/30/2024 2:42 PM  For on call review [email or web address].  "

## 2024-07-30 NOTE — Assessment & Plan Note (Addendum)
 CBC mildly elevated, -Adding 3 units with meal -Continue with SSI - Continue home Jardiance 

## 2024-07-30 NOTE — Hospital Course (Addendum)
 Partly taken from H&P.  Micheal Wong is a 53 y.o. year old male with medical history of hypertension, hyperlipidemia, type 2 diabetes, alcohol use disorder presenting to the ED after a mechanical fall.  No loss of consciousness, developed significant pain in his chest and left shoulder afterwards.  On presentation hemodynamically stable, labs with mild hypokalemia at 3.1, mildly elevated liver enzymes with AST at 51, ALT 59, ALP 144, mildly elevated troponin with downward trend.  Magnesium  normal.  Imaging shows acute comminuted displaced and impacted oblique fracture of left proximal humerus.  Also shows acute fractures of posterior right 8th, 9th and 12th ribs, no pneumothorax.  CT head was negative for any acute abnormality.  Orthopedic surgery was consulted and they advised conservative management with left upper extremity being nonweightbearing, sling immobilization and pain control.  12/23: Vital stable, hemoglobin with some decreased to 10.6, hypokalemia resolved with potassium at 3.9, CBG elevated at 188. Patient likely needs some surgical intervention as outpatient per orthopedic surgery.  12/24: Patient had 1 episode of fever at 100.8.  Repeat CBC with no leukocytosis, chest imaging with concern of atelectasis versus pneumonia.  Having mild cough.  Respiratory panel negative.  UA negative for any UTI.  Decided to treat with Levaquin  for 5 days.  Pain seems controlled with current regimen.  He was encouraged to use incentive spirometry and flutter valve with this recent multiple rib fractures.  Patient will continue to keep left upper extremity in sling and need to follow-up with orthopedic surgery as outpatient as he might need some surgical intervention and to be assessed by a shoulder specialist.  Patient will continue on current medications and need to have a close follow-up with his providers for further assistance.

## 2024-07-30 NOTE — Assessment & Plan Note (Signed)
Blood pressure currently within goal. -Continue home amlodipine and metoprolol

## 2024-07-30 NOTE — Assessment & Plan Note (Addendum)
 Multiple rib fractures involving right 8th, 9th and 12th rib. Secondary to mechanical fall.  Orthopedic surgery is on board and advising conservative management with sling and outpatient follow-up-likely will need some surgical intervention down the road by shoulder specialist. - Continue with pain management -Left upper extremity remained in sling and nonweightbearing. - PT/OT evaluation -Incentive spirometry and flutter valve for rib fractures

## 2024-07-31 ENCOUNTER — Other Ambulatory Visit: Payer: Self-pay

## 2024-07-31 DIAGNOSIS — S2241XA Multiple fractures of ribs, right side, initial encounter for closed fracture: Secondary | ICD-10-CM

## 2024-07-31 DIAGNOSIS — W19XXXA Unspecified fall, initial encounter: Secondary | ICD-10-CM

## 2024-07-31 DIAGNOSIS — I10 Essential (primary) hypertension: Secondary | ICD-10-CM | POA: Diagnosis not present

## 2024-07-31 DIAGNOSIS — S42425A Nondisplaced comminuted supracondylar fracture without intercondylar fracture of left humerus, initial encounter for closed fracture: Secondary | ICD-10-CM | POA: Diagnosis not present

## 2024-07-31 LAB — URINALYSIS, W/ REFLEX TO CULTURE (INFECTION SUSPECTED)
Bacteria, UA: NONE SEEN
Bilirubin Urine: NEGATIVE
Glucose, UA: >=500 mg/dL — AB
Hgb urine dipstick: NEGATIVE
Ketones, ur: NEGATIVE mg/dL
Leukocytes,Ua: NEGATIVE
Nitrite: NEGATIVE
Protein, ur: NEGATIVE mg/dL
Specific Gravity, Urine: 1.029 (ref 1.005–1.030)
Squamous Epithelial / HPF: 0 /HPF (ref 0–5)
pH: 6 (ref 5.0–8.0)

## 2024-07-31 LAB — CBC
HCT: 31.1 % — ABNORMAL LOW (ref 39.0–52.0)
Hemoglobin: 10.1 g/dL — ABNORMAL LOW (ref 13.0–17.0)
MCH: 30.5 pg (ref 26.0–34.0)
MCHC: 32.5 g/dL (ref 30.0–36.0)
MCV: 94 fL (ref 80.0–100.0)
Platelets: 177 K/uL (ref 150–400)
RBC: 3.31 MIL/uL — ABNORMAL LOW (ref 4.22–5.81)
RDW: 13.5 % (ref 11.5–15.5)
WBC: 8.8 K/uL (ref 4.0–10.5)
nRBC: 0 % (ref 0.0–0.2)

## 2024-07-31 LAB — GLUCOSE, CAPILLARY
Glucose-Capillary: 183 mg/dL — ABNORMAL HIGH (ref 70–99)
Glucose-Capillary: 99 mg/dL (ref 70–99)

## 2024-07-31 LAB — RESP PANEL BY RT-PCR (RSV, FLU A&B, COVID)  RVPGX2
Influenza A by PCR: NEGATIVE
Influenza B by PCR: NEGATIVE
Resp Syncytial Virus by PCR: NEGATIVE
SARS Coronavirus 2 by RT PCR: NEGATIVE

## 2024-07-31 MED ORDER — CYCLOBENZAPRINE HCL 10 MG PO TABS
10.0000 mg | ORAL_TABLET | Freq: Three times a day (TID) | ORAL | 0 refills | Status: AC | PRN
  Filled 2024-07-31: qty 30, 10d supply, fill #0

## 2024-07-31 MED ORDER — OXYCODONE HCL 5 MG PO TABS
5.0000 mg | ORAL_TABLET | ORAL | 0 refills | Status: AC | PRN
  Filled 2024-07-31: qty 30, 5d supply, fill #0

## 2024-07-31 MED ORDER — FE FUM-VIT C-VIT B12-FA 460-60-0.01-1 MG PO CAPS
1.0000 | ORAL_CAPSULE | Freq: Two times a day (BID) | ORAL | 0 refills | Status: AC
  Filled 2024-07-31: qty 60, 30d supply, fill #0

## 2024-07-31 MED ORDER — LEVOFLOXACIN 750 MG PO TABS
750.0000 mg | ORAL_TABLET | Freq: Every day | ORAL | 0 refills | Status: AC
  Filled 2024-07-31: qty 5, 5d supply, fill #0

## 2024-07-31 MED ORDER — ACETAMINOPHEN 325 MG PO TABS
650.0000 mg | ORAL_TABLET | Freq: Four times a day (QID) | ORAL | 0 refills | Status: AC | PRN
  Filled 2024-07-31: qty 100, 13d supply, fill #0

## 2024-07-31 NOTE — Plan of Care (Signed)
" °  Problem: Education: Goal: Knowledge of General Education information will improve Description: Including pain rating scale, medication(s)/side effects and non-pharmacologic comfort measures Outcome: Progressing   Problem: Coping: Goal: Level of anxiety will decrease Outcome: Progressing   Problem: Coping: Goal: Level of anxiety will decrease Outcome: Progressing   Problem: Pain Managment: Goal: General experience of comfort will improve and/or be controlled Outcome: Progressing   "

## 2024-07-31 NOTE — Discharge Summary (Signed)
 " Physician Discharge Summary   Patient: Micheal Wong MRN: [id number] DOB: 10/27/1970  Admit date:     07/29/2024  Discharge date: 07/31/2024  Discharge Physician: Amaryllis Dare   PCP: Dyane Carlyon Norris, FNP   Recommendations at discharge:  Please obtain CBC and BMP and follow-up Follow-up with orthopedic surgery within a week Follow-up with primary care provider  Discharge Diagnoses: Principal Problem:   Comminuted fracture of left humerus Active Problems:   Multiple rib fractures   Essential hypertension   Type 2 diabetes mellitus with hyperlipidemia (HCC)   Hyperlipemia   GERD (gastroesophageal reflux disease)   Multiple fractures   Fall   Multiple fractures of ribs, right side, initial encounter for closed fracture   Hospital Course: Partly taken from H&P.  Micheal Wong is a 53 y.o. year old male with medical history of hypertension, hyperlipidemia, type 2 diabetes, alcohol use disorder presenting to the ED after a mechanical fall.  No loss of consciousness, developed significant pain in his chest and left shoulder afterwards.  On presentation hemodynamically stable, labs with mild hypokalemia at 3.1, mildly elevated liver enzymes with AST at 51, ALT 59, ALP 144, mildly elevated troponin with downward trend.  Magnesium  normal.  Imaging shows acute comminuted displaced and impacted oblique fracture of left proximal humerus.  Also shows acute fractures of posterior right 8th, 9th and 12th ribs, no pneumothorax.  CT head was negative for any acute abnormality.  Orthopedic surgery was consulted and they advised conservative management with left upper extremity being nonweightbearing, sling immobilization and pain control.  12/23: Vital stable, hemoglobin with some decreased to 10.6, hypokalemia resolved with potassium at 3.9, CBG elevated at 188. Patient likely needs some surgical intervention as outpatient per orthopedic surgery.  12/24: Patient had 1 episode of fever at  100.8.  Repeat CBC with no leukocytosis, chest imaging with concern of atelectasis versus pneumonia.  Having mild cough.  Respiratory panel negative.  UA negative for any UTI.  Decided to treat with Levaquin  for 5 days.  Pain seems controlled with current regimen.  He was encouraged to use incentive spirometry and flutter valve with this recent multiple rib fractures.  Patient will continue to keep left upper extremity in sling and need to follow-up with orthopedic surgery as outpatient as he might need some surgical intervention and to be assessed by a shoulder specialist.  Patient will continue on current medications and need to have a close follow-up with his providers for further assistance.  Assessment and Plan: * Comminuted fracture of left humerus Multiple rib fractures involving right 8th, 9th and 12th rib. Secondary to mechanical fall.  Orthopedic surgery is on board and advising conservative management with sling and outpatient follow-up-likely will need some surgical intervention down the road by shoulder specialist. - Continue with pain management -Left upper extremity remained in sling and nonweightbearing. - PT/OT evaluation-with no significant follow-up -Incentive spirometry and flutter valve for rib fractures  Essential hypertension Blood pressure currently within goal. - Continue home amlodipine  and metoprolol   Type 2 diabetes mellitus with hyperlipidemia (HCC) CBC mildly elevated, -Adding 3 units with meal -Continue with SSI - Continue home Jardiance   Hyperlipemia - Continue home fenofibrate  and Lipitor   GERD (gastroesophageal reflux disease) - Continue Protonix    Pain control - [place]  Controlled Substance Reporting System database was reviewed. and patient was instructed, not to drive, operate heavy machinery, perform activities at heights, swimming or participation in water activities or provide baby-sitting services while on Pain, Sleep and Anxiety  Medications; until their outpatient Physician has advised to do so again. Also recommended to not to take more than prescribed Pain, Sleep and Anxiety Medications.  Consultants: Orthopedic surgery Procedures performed: None Disposition: Home Diet recommendation:  Carb modified diet DISCHARGE MEDICATION: Allergies as of 07/31/2024       Reactions   Drug Class [ace Inhibitors] Swelling   Swelling        Medication List     STOP taking these medications    sitaGLIPtin 100 MG tablet Commonly known as: JANUVIA   spironolactone 25 MG tablet Commonly known as: ALDACTONE       TAKE these medications    acetaminophen  325 MG tablet Commonly known as: TYLENOL  Take 2 tablets (650 mg total) by mouth every 6 (six) hours as needed for mild pain (pain score 1-3) or fever (or Fever >/= 101).   amitriptyline  75 MG tablet Commonly known as: ELAVIL  Take 75 mg by mouth at bedtime.   amLODipine  10 MG tablet Commonly known as: NORVASC  Take 10 mg by mouth daily.   aspirin  81 MG chewable tablet Chew 81 mg by mouth daily.   atorvastatin  80 MG tablet Commonly known as: LIPITOR  Take 1 tablet (80 mg total) by mouth at bedtime.   cyclobenzaprine  10 MG tablet Commonly known as: FLEXERIL  Take 1 tablet (10 mg total) by mouth 3 (three) times daily as needed.   Fe Fum-Vit C-Vit B12-FA Caps capsule Commonly known as: TRIGELS-F FORTE Take 1 capsule by mouth 2 (two) times daily.   fenofibrate  54 MG tablet Take 1 tablet (54 mg total) by mouth daily.   gabapentin  800 MG tablet Commonly known as: NEURONTIN  Take 800 mg by mouth 3 (three) times daily.   HumaLOG  KwikPen 100 UNIT/ML KwikPen Generic drug: insulin  lispro Inject 5 Units into the skin 3 (three) times daily. What changed: how much to take   Jardiance  25 MG Tabs tablet Generic drug: empagliflozin  Take 25 mg by mouth daily.   levofloxacin  750 MG tablet Commonly known as: Levaquin  Take 1 tablet (750 mg total) by mouth daily  for 5 days.   metFORMIN  500 MG tablet Commonly known as: GLUCOPHAGE  Take 500 mg by mouth daily with breakfast. Not sure of the dose   metoprolol  succinate 50 MG 24 hr tablet Commonly known as: TOPROL -XL Take 50 mg by mouth daily.   omeprazole 20 MG capsule Commonly known as: PRILOSEC Take 20 mg by mouth daily.   oxyCODONE  5 MG immediate release tablet Commonly known as: Oxy IR/ROXICODONE  Take 1 tablet (5 mg total) by mouth every 4 (four) hours as needed for moderate pain (pain score 4-6).        Follow-up Information     Dyane Carlyon Norris, FNP. Schedule an appointment as soon as possible for a visit in 1 week(s).   Specialty: Family Medicine Contact information: [address] [place] [address] [phone]         Ezra Jackquline RAMAN, MD. Schedule an appointment as soon as possible for a visit in 1 week(s).   Specialty: Orthopedic Surgery Contact information: [address] [place] [address] [phone]                Discharge Exam: Fredricka Weights   07/29/24 0905 07/30/24 0423 07/31/24 0338  Weight: 78.2 kg 81.4 kg 83.5 kg   General.  In no acute distress. Pulmonary.  Lungs clear bilaterally, normal respiratory effort. CV.  Regular rate and rhythm, no JVD, rub or murmur. Abdomen.  Soft, nontender,  nondistended, BS positive. CNS.  Alert and oriented .  No focal neurologic deficit. Extremities.  No edema,  pulses intact and symmetrical.  Left upper extremity in sling  Condition at discharge: stable  The results of significant diagnostics from this hospitalization (including imaging, microbiology, ancillary and laboratory) are listed below for reference.   Imaging Studies: CT CHEST WO CONTRAST Result Date: 07/30/2024 EXAM: CT CHEST WITHOUT CONTRAST 07/30/2024 06:34:05 AM TECHNIQUE: CT of the chest was performed without the administration of intravenous contrast. Multiplanar reformatted images are provided for review. Automated  exposure control, iterative reconstruction, and/or weight based adjustment of the mA/kV was utilized to reduce the radiation dose to as low as reasonably achievable. COMPARISON: Rib series from yesterday and CT abdomen and pelvis with contrast 10/26/2020. No prior chest CT. CLINICAL HISTORY: [place] yesterday with right rib cage pain and suspected right rib fractures on x-rays. FINDINGS: MEDIASTINUM: Heart: Mild cardiomegaly. The coronary arteries are heavily calcified, particularly for a 53 year old. Pericardium: No pericardial effusion. Vessels: There is age advanced atherosclerosis of the aorta and great vessels. The pulmonary arteries are slightly prominent. The pulmonary veins are normal in caliber. Central airways: The central airways are clear. LYMPH NODES: No mediastinal, hilar or axillary lymphadenopathy. LUNGS AND PLEURA: There is a small layering low density right pleural effusion. No left pleural fluid. Bronchial thickening noted in both lower lobes. There are coarse linear atelectatic markings in the posteromedial basal left lower lobe. In the right lower lobe, there is more prominent atelectasis or consolidation alongside the pleural effusion. There is linear atelectasis in the posterior right upper lobe. Above the plane of prior imaging there is a rounded pleural-based 9 mm ground-glass nodule anteromedially in the right upper lobe on axial image 71 of series 4. The remainder of both lungs are clear. No pneumothorax. SOFT TISSUES/BONES: Acute nondisplaced fracture of the posterolateral right 11th rib and a displaced fracture also acute of the posterior right 12th rib. Subacute partially healed fractures with callus of the right posterior 9th and 11th ribs. There is mild osteopenia. Degenerative changes of the spine. No spinal compression fracture is seen. No other acute osseous abnormality. No acute abnormality of the soft tissues. UPPER ABDOMEN: Limited images of the upper abdomen demonstrate moderate  fatty replacement in the liver. There is a new demonstration of numerous coarse pancreatic parenchymal calcifications are in keeping with chronic calcific pancreatitis. No acute inflammatory changes are seen. IMPRESSION: 1. Acute nondisplaced fracture of the posterolateral right 11th rib and displaced fracture of the posterior right 12th rib. 2. Subacute partially healed fractures with callus of the right posterior 9th and 11th ribs. 3. Small layering right pleural effusion with adjacent right lower lobe atelectasis or consolidation. 4. Rounded pleural-based 9 mm ground-glass nodule anteromedially in the right upper lobe; recommend chest CT at 6-12 months to confirm persistence, then CT every 2 years until 5 years, as per Fleischner Society Guidelines. 5. Numerous coarse pancreatic parenchymal calcifications consistent with chronic calcific pancreatitis. Electronically signed by: Francis Quam MD 07/30/2024 07:06 AM EST RP Workstation: [id number]   CT Shoulder Left Wo Contrast Result Date: 07/29/2024 CLINICAL DATA:  Proximal humerus fracture.  Preoperative evaluation. EXAM: CT OF THE UPPER LEFT EXTREMITY WITHOUT CONTRAST TECHNIQUE: Multidetector CT imaging of the upper left extremity was performed according to the standard protocol. RADIATION DOSE REDUCTION: This exam was performed according to the departmental dose-optimization program which includes automated exposure control, adjustment of the mA and/or kV according to patient size and/or use of iterative reconstruction  technique. COMPARISON:  Earlier same day radiographs of the left humerus. FINDINGS: Bones/Joint/Cartilage Acute oblique comminuted impacted fracture of the proximal humeral shaft extending from the proximal metadiaphysis through the proximal to mid shaft. The dominant comminuted butterfly fracture fragment measures approximately 7 cm in craniocaudal extent and 2.3 cm AP and is displaced approximately 1.7 cm posteriorly relative to the distal  fracture component (series 6, images 37-51 and series 2, image 90). Fracture margins extend through the lateral humeral head and neck junction, near the level of the inferior aspect of the greater tuberosity. The humeral head articular surface is intact. No dislocation. Mild glenohumeral joint osteoarthritis. No additional acute fracture identified. The acromioclavicular joint is anatomically aligned. Ligaments Ligaments are suboptimally evaluated by CT. Muscles and Tendons Intramuscular edema surrounding the proximal humeral shaft fracture described above. Soft tissue Subcutaneous edema extending along the upper arm. No loculated fluid collection. No enlarged lymph nodes identified in the field of view. Thoracic aortic atherosclerosis. Visualized portions of the lungs are clear. IMPRESSION: 1. Acute comminuted displaced and impacted oblique fracture of the left proximal humeral shaft extending from the proximal metadiaphysis through the proximal to mid shaft, as described above. No dislocation. 2. Intramuscular and subcutaneous edema of the left upper arm surrounding the proximal humeral shaft fracture. 3.  Aortic Atherosclerosis (ICD10-I70.0). Electronically Signed   By: Harrietta Sherry M.D.   On: 07/29/2024 12:41   DG Ribs Unilateral W/Chest Right Result Date: 07/29/2024 EXAM: 1 AP VIEW XRAY OF THE RIGHT RIBS AND CHEST 07/29/2024 10:06:00 AM COMPARISON: None available. CLINICAL HISTORY: fall FINDINGS: BONES: Healed fractures of the posterior right 10th and 11th ribs with callus formation. Potential acute fractures of the posterior right 8th, 9th, and 12th ribs. LUNGS AND PLEURA: No consolidation or pulmonary edema. No pleural effusion or pneumothorax. HEART AND MEDIASTINUM: Aortic calcification. No acute abnormality of the cardiac and mediastinal silhouettes. IMPRESSION: 1. Potential acute fractures of the posterior right 12th, 9th, and 8th ribs. 2. No pneumothorax. 3. Healed fractures of the posterior right  10th and 11th ribs with callus formation. Electronically signed by: Norleen Boxer MD 07/29/2024 10:52 AM EST RP Workstation: [id number]   CT HEAD WO CONTRAST ( ) Result Date: 07/29/2024 EXAM: CT HEAD WITHOUT CONTRAST 07/29/2024 10:29:51 AM TECHNIQUE: CT of the head was performed without the administration of intravenous contrast. Automated exposure control, iterative reconstruction, and/or weight based adjustment of the mA/kV was utilized to reduce the radiation dose to as low as reasonably achievable. COMPARISON: 03/18/2024 CLINICAL HISTORY: trauma FINDINGS: BRAIN AND VENTRICLES: No acute hemorrhage. No evidence of acute infarct. No hydrocephalus. No extra-axial collection. No mass effect or midline shift. ORBITS: No acute abnormality. SINUSES: Mucosal thickening in right sphenoid sinus. SOFT TISSUES AND SKULL: No acute soft tissue abnormality. No skull fracture. IMPRESSION: 1. No acute intracranial abnormality. 2. Mucosal thickening in the right sphenoid sinus. Electronically signed by: Evalene Coho MD 07/29/2024 10:52 AM EST RP Workstation: [id number]   DG Humerus Left Result Date: 07/29/2024 EXAM: 1 VIEW(S) XRAY OF THE LEFT HUMERUS 07/29/2024 10:06:00 AM COMPARISON: None available. CLINICAL HISTORY: fall FINDINGS: BONES AND JOINTS: Oblique comminuted fracture of proximal humeral shaft. Medial angulation of distal fracture fragment. Glenohumeral joint intact. SOFT TISSUES: The soft tissues are unremarkable. IMPRESSION: 1. Oblique comminuted fracture of the proximal humeral shaft with medial angulation of the distal fracture fragment. Electronically signed by: Norleen Boxer MD 07/29/2024 10:46 AM EST RP Workstation: [id number]   DG Elbow Complete Left Result Date: 07/29/2024 EXAM: 3 VIEW(S) XRAY OF THE  LEFT ELBOW COMPARISON: None available. CLINICAL HISTORY: fall FINDINGS: BONES AND JOINTS: No acute fracture. No malalignment. SOFT TISSUES: The soft tissues are unremarkable. IMPRESSION: 1. No  evidence of acute traumatic injury. Electronically signed by: Norleen Boxer MD 07/29/2024 10:44 AM EST RP Workstation: [id number]    Microbiology: Results for orders placed or performed during the hospital encounter of 07/29/24  Resp panel by RT-PCR (RSV, Flu A&B, Covid) Anterior Nasal Swab     Status: None   Collection Time: 07/31/24  8:40 AM   Specimen: Anterior Nasal Swab  Result Value Ref Range Status   SARS Coronavirus 2 by RT PCR NEGATIVE NEGATIVE Final    Comment: (NOTE) SARS-CoV-2 target nucleic acids are NOT DETECTED.  The SARS-CoV-2 RNA is generally detectable in upper respiratory specimens during the acute phase of infection. The lowest concentration of SARS-CoV-2 viral copies this assay can detect is 138 copies/mL. A negative result does not preclude SARS-Cov-2 infection and should not be used as the sole basis for treatment or other patient management decisions. A negative result may occur with  improper specimen collection/handling, submission of specimen other than nasopharyngeal swab, presence of viral mutation(s) within the areas targeted by this assay, and inadequate number of viral copies(<138 copies/mL). A negative result must be combined with clinical observations, patient history, and epidemiological information. The expected result is Negative.  Fact Sheet for Patients:  bloggercourse.com  Fact Sheet for Healthcare Providers:  seriousbroker.it  This test is no t yet approved or cleared by the [place]  FDA and  has been authorized for detection and/or diagnosis of SARS-CoV-2 by FDA under an Emergency Use Authorization (EUA). This EUA will remain  in effect (meaning this test can be used) for the duration of the COVID-19 declaration under Section 564(b)(1) of the Act, 21 U.S.C.section 360bbb-3(b)(1), unless the authorization is terminated  or revoked sooner.       Influenza A by PCR NEGATIVE NEGATIVE  Final   Influenza B by PCR NEGATIVE NEGATIVE Final    Comment: (NOTE) The Xpert Xpress SARS-CoV-2/FLU/RSV plus assay is intended as an aid in the diagnosis of influenza from Nasopharyngeal swab specimens and should not be used as a sole basis for treatment. Nasal washings and aspirates are unacceptable for Xpert Xpress SARS-CoV-2/FLU/RSV testing.  Fact Sheet for Patients: bloggercourse.com  Fact Sheet for Healthcare Providers: seriousbroker.it  This test is not yet approved or cleared by the [place]  FDA and has been authorized for detection and/or diagnosis of SARS-CoV-2 by FDA under an Emergency Use Authorization (EUA). This EUA will remain in effect (meaning this test can be used) for the duration of the COVID-19 declaration under Section 564(b)(1) of the Act, 21 U.S.C. section 360bbb-3(b)(1), unless the authorization is terminated or revoked.     Resp Syncytial Virus by PCR NEGATIVE NEGATIVE Final    Comment: (NOTE) Fact Sheet for Patients: bloggercourse.com  Fact Sheet for Healthcare Providers: seriousbroker.it  This test is not yet approved or cleared by the [place]  FDA and has been authorized for detection and/or diagnosis of SARS-CoV-2 by FDA under an Emergency Use Authorization (EUA). This EUA will remain in effect (meaning this test can be used) for the duration of the COVID-19 declaration under Section 564(b)(1) of the Act, 21 U.S.C. section 360bbb-3(b)(1), unless the authorization is terminated or revoked.  Performed at [facility], [address] [place], [place] [address]     Labs: CBC: Recent Labs  Lab 07/29/24 0915 07/30/24 0600 07/31/24 1303  WBC 8.1 8.7  8.8  NEUTROABS 6.0  --   --   HGB 12.4* 10.6* 10.1*  HCT 38.2* 33.1* 31.1*  MCV 94.6 94.8 94.0  PLT 208 157 177   Basic Metabolic Panel: Recent Labs  Lab 07/29/24 0915  07/30/24 0600  NA 140 137  K 3.1* 3.9  CL 101 102  CO2 29 26  GLUCOSE 146* 186*  BUN 8 8  CREATININE 0.93 0.70  CALCIUM  9.1 8.6*  MG 2.0  --    Liver Function Tests: Recent Labs  Lab 07/29/24 0915  AST 51*  ALT 59*  ALKPHOS 144*  BILITOT 0.5  PROT 6.3*  ALBUMIN 3.5   CBG: Recent Labs  Lab 07/30/24 1205 07/30/24 1643 07/30/24 1932 07/31/24 0747 07/31/24 1157  GLUCAP 169* 157* 203* 183* 99    Discharge time spent: greater than 30 minutes.  This record has been created using Conservation officer, historic buildings. Errors have been sought and corrected,but may not always be located. Such creation errors do not reflect on the standard of care.   Signed: Amaryllis Dare, MD [facility] 07/31/2024 "

## 2024-07-31 NOTE — Progress Notes (Signed)
 Occupational Therapy Treatment Patient Details Name: Micheal Wong MRN: [id number] DOB: 03-24-1971 Today's Date: 07/31/2024   History of present illness 53 y.o. male with a past medical history of diabetes, hypertension, alcoholic pancreatitis, congestive heart failure, A-fib who presents with left shoulder pain after a fall.  He had a mechanical ground-level fall yesterday morning noting immediate and significant left shoulder pain afterwards.  He was found to have a left proximal humerus fracture and orthopedics has been consulted for this injury.  Work up also revealed right ribs fracture of the 12th rib.  Also fracture of the 9th and potential fracture of the 8th.   OT comments  Pt progressing towards goals. Pt required SBA for bed mobility and STS transfers, ambulating ~100' with SBA-CGA and mild unsteadiness. Pt edu in falls prevention, safety, home/routines modifications for ADL and mobility. Pt verbalized understanding. He reported not knowing how to use his new Dexcom to manage his diabetes. Care team notified to see if a diabetes coordinator could come educate him to improve self mgt of chronic disease. Pt continues to demo questionable safety awareness and cognition. Will continue to progress as able.      If plan is discharge home, recommend the following:  A little help with walking and/or transfers;A lot of help with bathing/dressing/bathroom;Assist for transportation;Assistance with cooking/housework;Help with stairs or ramp for entrance   Equipment Recommendations  None recommended by OT    Recommendations for Other Services      Precautions / Restrictions Precautions Precautions: Sternal;Shoulder Type of Shoulder Precautions: no WB, no ROM Shoulder Interventions: Shoulder sling/immobilizer;At all times Recall of Precautions/Restrictions: Impaired Restrictions Weight Bearing Restrictions Per Provider Order: Yes LUE Weight Bearing Per Provider Order: Non weight bearing        Mobility Bed Mobility Overal bed mobility: Needs Assistance Bed Mobility: Supine to Sit, Sit to Supine     Supine to sit: Supervision, HOB elevated, Used rails Sit to supine: Supervision, HOB elevated        Transfers Overall transfer level: Needs assistance Equipment used: None Transfers: Sit to/from Stand Sit to Stand: Supervision, Contact guard assist                 Balance Overall balance assessment: Needs assistance Sitting-balance support: No upper extremity supported, Feet supported Sitting balance-Leahy Scale: Good     Standing balance support: No upper extremity supported Standing balance-Leahy Scale: Fair                             ADL either performed or assessed with clinical judgement   ADL Overall ADL's : Needs assistance/impaired                     Lower Body Dressing: Sit to/from stand;Minimal assistance   Toilet Transfer: Contact guard assist;Supervision/safety Toilet Transfer Details (indicate cue type and reason): simulated, SBA                Extremity/Trunk Assessment              Vision       Perception     Praxis     Communication Communication Communication: No apparent difficulties   Cognition Arousal: Alert   Cognition: No family/caregiver present to determine baseline             OT - Cognition Comments: Pt demo's questionable safety awareness and cognition  Cueing   Cueing Techniques: Verbal cues  Exercises Other Exercises Other Exercises: Pt edu in falls prevention, safety, home/routines modifications for ADL and mobility while ambulating ~100' with SBA-CGA    Shoulder Instructions       General Comments      Pertinent Vitals/ Pain       Pain Assessment Pain Assessment: 0-10 Pain Score: 7  Pain Location: L shoulder, R ribs Pain Descriptors / Indicators: Aching, Grimacing, Guarding Pain Intervention(s): Limited activity within  patient's tolerance, Monitored during session, Repositioned, Patient requesting pain meds-RN notified  Home Living                                          Prior Functioning/Environment              Frequency  Min 2X/week        Progress Toward Goals  OT Goals(current goals can now be found in the care plan section)  Progress towards OT goals: Progressing toward goals  Acute Rehab OT Goals Patient Stated Goal: have less pain and get better OT Goal Formulation: With patient Time For Goal Achievement: 08/13/24 Potential to Achieve Goals: Good  Plan      Co-evaluation                 AM-PAC OT 6 Clicks Daily Activity     Outcome Measure   Help from another person eating meals?: None Help from another person taking care of personal grooming?: A Little Help from another person toileting, which includes using toliet, bedpan, or urinal?: A Little Help from another person bathing (including washing, rinsing, drying)?: A Little Help from another person to put on and taking off regular upper body clothing?: A Lot Help from another person to put on and taking off regular lower body clothing?: A Little 6 Click Score: 18    End of Session Equipment Utilized During Treatment: Gait belt  OT Visit Diagnosis: Other abnormalities of gait and mobility (R26.89);History of falling (Z91.81);Pain Pain - Right/Left: Right Pain - part of body: Shoulder;Arm   Activity Tolerance Patient tolerated treatment well   Patient Left in bed;with call bell/phone within reach;with bed alarm set   Nurse Communication Mobility status;Patient requests pain meds;Other (comment) (pt reports needing help using dexcom)        Time: 8894-8880 OT Time Calculation (min): 14 min  Charges: OT General Charges $OT Visit: 1 Visit OT Treatments $Self Care/Home Management : 8-22 mins  Warren SAUNDERS., MPH, MS, OTR/L ascom [phone] 07/31/2024, 11:54 AM

## 2024-07-31 NOTE — Plan of Care (Signed)

## 2024-07-31 NOTE — Progress Notes (Signed)
 Reviewed discharge instructions with patient. Patient acknowledged understanding. Patient received meds to bedside. Patient discharged with personal belongings. Patient wheeled out by staff. Patient transported home via family vehicle. No distress noted in patient.

## 2024-07-31 NOTE — Progress Notes (Signed)
 Physical Therapy Treatment Patient Details Name: Micheal Wong MRN: [id number] DOB: 10/29/1970 Today's Date: 07/31/2024   History of Present Illness 53 y.o. male with a past medical history of diabetes, hypertension, alcoholic pancreatitis, congestive heart failure, A-fib who presents with left shoulder pain after a fall.  He had a mechanical ground-level fall yesterday morning noting immediate and significant left shoulder pain afterwards.  He was found to have a left proximal humerus fracture and orthopedics has been consulted for this injury.  Work up also revealed right ribs fracture of the 12th rib.  Also fracture of the 9th and potential fracture of the 8th.    PT Comments  Pt received in Semi-Fowler's position and agreeable to therapy.  Pt took encouragement to get out of the bed, requesting pain meds prior to getting up with therapy.  Pt's friend/neighbor in room and encouraged pt to perform treatment session with therapist.  Pt ultimately mobilized better today than in previous session, however is still having instances of staggering to the L and the R at times.  Pt almost presents as being intoxicated and nursing notified.  Pt tends to gravitate towards walls and hand rails for support when ambulating in the hallway.  Pt then returned to the bed and was left with all needs met, nursing and friend in the room upon leaving.      If plan is discharge home, recommend the following: A little help with walking and/or transfers;A little help with bathing/dressing/bathroom;Assist for transportation;Help with stairs or ramp for entrance   Can travel by private vehicle        Equipment Recommendations  Cane    Recommendations for Other Services       Precautions / Restrictions Precautions Precautions: Shoulder;Sternal Type of Shoulder Precautions: no WB, no ROM Shoulder Interventions: Shoulder sling/immobilizer;At all times Recall of Precautions/Restrictions: Impaired Restrictions Weight  Bearing Restrictions Per Provider Order: Yes LUE Weight Bearing Per Provider Order: Non weight bearing     Mobility  Bed Mobility Overal bed mobility: Needs Assistance Bed Mobility: Supine to Sit, Sit to Supine     Supine to sit: Supervision, HOB elevated, Used rails Sit to supine: Supervision, HOB elevated        Transfers Overall transfer level: Needs assistance Equipment used: None Transfers: Sit to/from Stand Sit to Stand: Supervision, Contact guard assist                Ambulation/Gait Ambulation/Gait assistance: Contact guard assist, Min assist Gait Distance (Feet): 520 Feet Assistive device: None Gait Pattern/deviations: Step-through pattern, Staggering left, Staggering right, Drifts right/left Gait velocity: decreased     General Gait Details: Pt continues to be unaware of surroundings and staggering left/right at times   Stairs             Wheelchair Mobility     Tilt Bed    Modified Rankin (Stroke Patients Only)       Balance Overall balance assessment: Needs assistance Sitting-balance support: No upper extremity supported, Feet supported Sitting balance-Leahy Scale: Good     Standing balance support: No upper extremity supported Standing balance-Leahy Scale: Fair                              Hotel Manager: No apparent difficulties  Cognition Arousal: Alert Behavior During Therapy: Impulsive (Pt semingly out of it at times, almost presenting as intoxicated.)   PT - Cognitive impairments: No apparent impairments  Following commands: Intact      Cueing Cueing Techniques: Verbal cues  Exercises      General Comments        Pertinent Vitals/Pain Pain Assessment Pain Assessment: Faces Faces Pain Scale: Hurts even more Pain Location: L shoulder, R ribs Pain Descriptors / Indicators: Aching, Grimacing, Guarding Pain Intervention(s): Limited  activity within patient's tolerance, Monitored during session, Repositioned, Premedicated before session    Home Living                          Prior Function            PT Goals (current goals can now be found in the care plan section) Acute Rehab PT Goals Patient Stated Goal: to be able to go home. PT Goal Formulation: With patient Time For Goal Achievement: 08/13/24 Potential to Achieve Goals: Good Progress towards PT goals: Progressing toward goals    Frequency    Min 2X/week      PT Plan      Co-evaluation              AM-PAC PT 6 Clicks Mobility   Outcome Measure  Help needed turning from your back to your side while in a flat bed without using bedrails?: A Little Help needed moving from lying on your back to sitting on the side of a flat bed without using bedrails?: A Little Help needed moving to and from a bed to a chair (including a wheelchair)?: A Little Help needed standing up from a chair using your arms (e.g., wheelchair or bedside chair)?: A Little Help needed to walk in hospital room?: A Little Help needed climbing 3-5 steps with a railing? : A Lot 6 Click Score: 17    End of Session Equipment Utilized During Treatment: Other (comment);Gait belt (L arm sling) Activity Tolerance: Patient tolerated treatment well Patient left: in bed;with call bell/phone within reach;with bed alarm set Nurse Communication: Mobility status PT Visit Diagnosis: Unsteadiness on feet (R26.81);Other abnormalities of gait and mobility (R26.89);Muscle weakness (generalized) (M62.81);Difficulty in walking, not elsewhere classified (R26.2);Pain Pain - Right/Left: Left Pain - part of body: Shoulder;Arm     Time: 8586-8566 PT Time Calculation (min) (ACUTE ONLY): 20 min  Charges:    $Therapeutic Activity: 8-22 mins PT General Charges $$ ACUTE PT VISIT: 1 Visit                     Fonda Simpers, PT, DPT Physical Therapist - [facility]  07/31/2024, 4:01 PM
